# Patient Record
Sex: Female | Born: 1970
Health system: Southern US, Community
[De-identification: ages and names within clinical notes are randomized; demographics above are authoritative.]

## PROBLEM LIST (undated history)

## (undated) DIAGNOSIS — J309 Allergic rhinitis, unspecified: Secondary | ICD-10-CM

## (undated) HISTORY — PX: OTHER SURGICAL HISTORY: SHX169

## (undated) HISTORY — DX: Allergic rhinitis, unspecified: J30.9

---

## 2000-05-07 ENCOUNTER — Ambulatory Visit (HOSPITAL_COMMUNITY): Admission: AD | Admit: 2000-05-07 | Discharge: 2000-05-07 | Payer: Self-pay | Admitting: Obstetrics and Gynecology

## 2000-07-29 ENCOUNTER — Ambulatory Visit (HOSPITAL_COMMUNITY): Admission: RE | Admit: 2000-07-29 | Discharge: 2000-07-29 | Payer: Self-pay | Admitting: Obstetrics and Gynecology

## 2000-07-29 ENCOUNTER — Encounter: Payer: Self-pay | Admitting: Obstetrics and Gynecology

## 2000-08-06 ENCOUNTER — Inpatient Hospital Stay (HOSPITAL_COMMUNITY): Admission: AD | Admit: 2000-08-06 | Discharge: 2000-08-08 | Payer: Self-pay | Admitting: Obstetrics and Gynecology

## 2000-09-10 ENCOUNTER — Other Ambulatory Visit: Admission: RE | Admit: 2000-09-10 | Discharge: 2000-09-10 | Payer: Self-pay | Admitting: Obstetrics and Gynecology

## 2001-11-15 ENCOUNTER — Other Ambulatory Visit: Admission: RE | Admit: 2001-11-15 | Discharge: 2001-11-15 | Payer: Self-pay | Admitting: Obstetrics and Gynecology

## 2002-11-23 ENCOUNTER — Other Ambulatory Visit: Admission: RE | Admit: 2002-11-23 | Discharge: 2002-11-23 | Payer: Self-pay | Admitting: Obstetrics and Gynecology

## 2003-12-26 ENCOUNTER — Other Ambulatory Visit: Admission: RE | Admit: 2003-12-26 | Discharge: 2003-12-26 | Payer: Self-pay | Admitting: Obstetrics and Gynecology

## 2004-12-26 ENCOUNTER — Other Ambulatory Visit: Admission: RE | Admit: 2004-12-26 | Discharge: 2004-12-26 | Payer: Self-pay | Admitting: Obstetrics & Gynecology

## 2012-09-29 ENCOUNTER — Encounter: Payer: Self-pay | Admitting: Family Medicine

## 2012-09-29 ENCOUNTER — Ambulatory Visit (INDEPENDENT_AMBULATORY_CARE_PROVIDER_SITE_OTHER): Payer: 59 | Admitting: Family Medicine

## 2012-09-29 VITALS — BP 120/67 | HR 88 | Temp 98.7°F | Ht 68.0 in | Wt 142.0 lb

## 2012-09-29 DIAGNOSIS — B9689 Other specified bacterial agents as the cause of diseases classified elsewhere: Secondary | ICD-10-CM

## 2012-09-29 DIAGNOSIS — J309 Allergic rhinitis, unspecified: Secondary | ICD-10-CM | POA: Insufficient documentation

## 2012-09-29 DIAGNOSIS — A499 Bacterial infection, unspecified: Secondary | ICD-10-CM

## 2012-09-29 DIAGNOSIS — J329 Chronic sinusitis, unspecified: Secondary | ICD-10-CM

## 2012-09-29 HISTORY — DX: Allergic rhinitis, unspecified: J30.9

## 2012-09-29 MED ORDER — DOXYCYCLINE HYCLATE 100 MG PO TABS: ORAL_TABLET | ORAL | Status: AC

## 2012-09-29 NOTE — Progress Notes (Signed)
CC: Renee Fletcher is a 42 y.o. female is here for Establish Care and Sinusitis   Subjective: HPI:  Very pleasant 42 year old presents to establish care  Acute complaint of pressure between the eyes and just above the left eyebrow. This is been present for 3 weeks, mild to moderate in severity, has not improved whatsoever since onset.  pain is nonradiating, present 24 hours a day, worse when lying down flat. Does not interfere her sleep. Initially associated with ear pressure, irritation of the throat, mild cough, however all of this is resolved. Continues to have clear nasal drainage throughout the day.  Is taking antihistamine and decongestant with Mucinex however no improvement in her symptoms. Has tried Nasonex for a week without improvement.  Review of Systems - General ROS: negative for - chills, fever, night sweats, weight gain or weight loss Ophthalmic ROS: negative for - decreased vision Psychological ROS: negative for - anxiety or depression ENT ROS: negative for - hearing change, tinnitus  Hematological and Lymphatic ROS: negative for - bleeding problems, bruising or swollen lymph nodes Breast ROS: negative Respiratory ROS: no cough, shortness of breath, or wheezing Cardiovascular ROS: no chest pain or dyspnea on exertion Gastrointestinal ROS: no abdominal pain, change in bowel habits, or black or bloody stools Genito-Urinary ROS: negative for - genital discharge, genital ulcers, incontinence or abnormal bleeding from genitals Musculoskeletal ROS: negative for - joint pain or muscle pain Neurological ROS: negative for - headaches or memory loss Dermatological ROS: negative for lumps, mole changes, rash and skin lesion changes  Past Medical History  Diagnosis Date  . Allergic rhinitis 09/29/2012     Family History  Problem Relation Age of Onset  . Liver cancer      aunt     History  Substance Use Topics  . Smoking status: Never Smoker   . Smokeless tobacco: Not on file   . Alcohol Use: No     Objective: Filed Vitals:   09/29/12 1132  BP: 120/67  Pulse: 88  Temp: 98.7 F (37.1 C)    General: Alert and Oriented, No Acute Distress HEENT: Pupils equal, round, reactive to light. Conjunctivae clear.  External ears unremarkable, canals clear with intact TMs with appropriate landmarks.  Middle ear appears open without effusion. Pink inferior turbinates.  Moist mucous membranes, pharynx without inflammation nor lesions.  Neck supple without palpable lymphadenopathy nor abnormal masses. Left frontal sinus tenderness to percussion Lungs: Clear to auscultation bilaterally, no wheezing/ronchi/rales.  Comfortable work of breathing. Good air movement. Cardiac: Regular rate and rhythm. Normal S1/S2.  No murmurs, rubs, nor gallops.   Extremities: No peripheral edema.  Strong peripheral pulses.  Mental Status: No depression, anxiety, nor agitation. Skin: Warm and dry.  Assessment & Plan: [name] was seen today for establish care and sinusitis.  Diagnoses and associated orders for this visit:  Bacterial sinusitis - doxycycline (VIBRA-TABS) 100 MG tablet; One by mouth twice a day for ten days.  Allergic rhinitis  Other Orders - Loratadine (ALAVERT PO); Take by mouth. - GuaiFENesin (MUCINEX PO); Take by mouth. - Multiple Vitamin (MULTIVITAMINS PO); Take by mouth.    Bacterial sinusitis: Penicillin allergy therefore start doxycycline. Continue antihistamine and Mucinex, add nasal saline washes.  May continue Nasonex as well. Signs and symptoms requring emergent/urgent reevaluation were discussed with the patient.  Return in about 4 weeks (around 10/27/2012) for CPE.

## 2016-04-01 ENCOUNTER — Encounter: Payer: Self-pay | Admitting: Osteopathic Medicine

## 2016-04-01 ENCOUNTER — Ambulatory Visit (INDEPENDENT_AMBULATORY_CARE_PROVIDER_SITE_OTHER): Payer: 59 | Admitting: Osteopathic Medicine

## 2016-04-01 VITALS — BP 122/79 | HR 70 | Ht 67.0 in | Wt 127.0 lb

## 2016-04-01 DIAGNOSIS — Z0000 Encounter for general adult medical examination without abnormal findings: Secondary | ICD-10-CM | POA: Diagnosis not present

## 2016-04-01 DIAGNOSIS — F329 Major depressive disorder, single episode, unspecified: Secondary | ICD-10-CM

## 2016-04-01 DIAGNOSIS — F4321 Adjustment disorder with depressed mood: Secondary | ICD-10-CM

## 2016-04-01 DIAGNOSIS — K4090 Unilateral inguinal hernia, without obstruction or gangrene, not specified as recurrent: Secondary | ICD-10-CM

## 2016-04-01 DIAGNOSIS — R7989 Other specified abnormal findings of blood chemistry: Secondary | ICD-10-CM

## 2016-04-01 DIAGNOSIS — Z23 Encounter for immunization: Secondary | ICD-10-CM | POA: Diagnosis not present

## 2016-04-01 DIAGNOSIS — D72819 Decreased white blood cell count, unspecified: Secondary | ICD-10-CM

## 2016-04-01 DIAGNOSIS — J302 Other seasonal allergic rhinitis: Secondary | ICD-10-CM

## 2016-04-01 NOTE — Patient Instructions (Signed)
Please return at your convenience for routine screening blood work. Please let us know if you haven't heard back about your results one week after your blood draw.  We have updated her tetanus vaccination today. We recommend annual flu vaccination as well, please return to the clinic sometime in the fall, September October, to update this vaccine.  Helpful to have previous Pap smear records and mammogram records, if he could have your OB/GYN for redness these results, we would greatly appreciate it  Any other questions or concerns, please do not hesitate to let us know how we can help! Otherwise, let's plan to follow-up in one year for annual physical exam and routine screening lab work.

## 2016-04-01 NOTE — Progress Notes (Signed)
HPI: Renee Fletcher is a 45 y.o. Not Hispanic or Latino female  who presents to [facility] today, 04/01/16,  for chief complaint of:  Chief Complaint  Patient presents with  . Establish Care    No medical complaints, requests annual physical    Preventive care reviewed as below  Request referral to surgeon, patient feeling lump in left inguinal area, comes and goes, worse with standing up.  Positive screening for depression concerns, patient is going through separation with husband at this point, doesn't feel medications or counseling will be helpful to her, no SI.  Past medical, surgical, social and family history reviewed: Past Medical History:  Diagnosis Date  . Allergic rhinitis 09/29/2012   Past Surgical History:  Procedure Laterality Date  . left femur fracture internal fixation     Social History  Substance Use Topics  . Smoking status: Never Smoker  . Smokeless tobacco: Not on file  . Alcohol use No   Family History  Problem Relation Age of Onset  . Liver cancer      aunt     Current medication list and allergy/intolerance information reviewed:   Current Outpatient Prescriptions  Medication Sig Dispense Refill  . BIOTIN PO Take by mouth.    . COLLAGEN PO Take by mouth.    . loratadine-pseudoephedrine (CLARITIN-D 12-HOUR) 5-120 MG tablet Take 1 tablet by mouth 2 (two) times daily.    . Multiple Vitamin (MULTIVITAMINS PO) Take by mouth.    . Saccharomyces boulardii (PROBIOTIC) 250 MG CAPS Take by mouth.     No current facility-administered medications for this visit.    Allergies  Allergen Reactions  . Penicillins     Unsure of reaction; had positive allergy as a child      Review of Systems:  Constitutional:  No  fever, no chills, No recent illness, No unintentional weight changes. No significant fatigue.   HEENT: No  headache, no vision change, no hearing change, No sore throat, No  sinus pressure  Cardiac: No   chest pain, No  pressure, No palpitations, No  Orthopnea  Respiratory:  No  shortness of breath. No  Cough, (+) hay fever / allergies  Gastrointestinal: No  abdominal pain, No  nausea, No  vomiting,  No  blood in stool, No  diarrhea, No  Constipation. (+) concern about hernia  Musculoskeletal: No new myalgia/arthralgia  Genitourinary: No  incontinence, No  abnormal genital bleeding, No abnormal genital discharge  Skin: No  Rash, No other wounds/concerning lesions  Hem/Onc: No  easy bruising/bleeding, No  abnormal lymph node  Endocrine: No cold intolerance,  No heat intolerance. No polyuria/polydipsia/polyphagia   Neurologic: No  weakness, No  dizziness, No  slurred speech/focal weakness/facial droop  Psychiatric: (+) concerns with depression - going through separation with husband, No  concerns with anxiety, No sleep problems, No mood problems   Exam:  BP 122/79   Pulse 70   Ht 5\' 7"  (1.702 m)   Wt 127 lb (57.6 kg)   BMI 19.89 kg/m   Constitutional: VS see above. General Appearance: alert, well-developed, well-nourished, NAD  Eyes: Normal lids and conjunctive, non-icteric sclera  Ears, Nose, Mouth, Throat: MMM, Normal external inspection ears/nares/mouth/lips/gums. TM normal bilaterally. Pharynx/tonsils no erythema, no exudate. Nasal mucosa normal.   Neck: No masses, trachea midline. No thyroid enlargement. No tenderness/mass appreciated. No lymphadenopathy  Respiratory: Normal respiratory effort. no wheeze, no rhonchi, no rales  Cardiovascular: S1/S2 normal, no murmur, no rub/gallop auscultated.  RRR. No lower extremity edema. Pedal pulse II/IV bilaterally DP and PT. No carotid bruit or JVD. No abdominal aortic bruit.  Gastrointestinal: Nontender, no masses. No hepatomegaly, no splenomegaly. No left inguinal hernia appreciated - reducible. Bowel sounds normal. Rectal exam deferred.   Musculoskeletal: Gait normal. No clubbing/cyanosis of digits.   Neurological: No cranial  nerve deficit on limited exam. Motor and sensation intact and symmetric. Cerebellar reflexes intact. Normal balance/coordination. No tremor.   Skin: warm, dry, intact. No rash/ulcer. No concerning nevi or subq nodules on limited exam.    Psychiatric: Normal judgment/insight. Normal mood and affect. Oriented x3.      ASSESSMENT/PLAN:   No major concerns on annual wellness exam. Return in one year unless needed sooner. Encouraged get records from OB/GYN regarding most recent Pap and mammogram results. Encouraged return to clinic in the fall for flu vaccination  Encounter for annual physical exam - Plan: Saccharomyces boulardii (PROBIOTIC) 250 MG CAPS, COLLAGEN PO, BIOTIN PO, CBC with Differential/Platelet, COMPLETE METABOLIC PANEL WITH GFR, Lipid panel  Unilateral inguinal hernia without obstruction or gangrene, recurrence not specified - Plan: Ambulatory referral to General Surgery  Reactive depression (situational)  Need for tetanus booster - Plan: Tdap vaccine greater than or equal to 7yo IM  Seasonal allergies - Plan: loratadine-pseudoephedrine (CLARITIN-D 12-HOUR) 5-120 MG tablet    FEMALE PREVENTIVE CARE  ANNUAL SCREENING/COUNSELING Tobacco - noNever  Alcohol - none Diet/Exercise - HEALTHY HABITS DISCUSSED TO DECREASE CV RISK  Depression - PQH2 Positive situational  Domestic violence concerns - no HTN SCREENING - SEE VITALS Vaccination status - SEE BELOW  SEXUAL HEALTH Sexually active in the past year - Yes with female. STI - The patient denies history of sexually transmitted disease. STI testing today? - no  INFECTIOUS DISEASE SCREENING HIV - all adults 15-65 - needs declined GC/CT - sexually active - needs - declined HepC - DOB [birth year] - does not need TB - does not need  DISEASE SCREENING Lipid - needs - patient will return for fasting labs DM2 - needs - return for fasting labs Osteoporosis - does not need  CANCER SCREENING Cervical - does not need -  following with OBGYN - asked patient to please ask the to send [place] records Breast - does not need - mammograms at OB/GYN, need records Lung - does not need Colon - does not need  ADULT VACCINATION Influenza - was not indicated but encouraged to come back for this in the fall Td - was given HPV - was not indicated Zoster - was not indicated Pneumonia - was not indicated  OTHER Fall - exercise and Vit D age 75+ - does not need Consider ASA - age 49-59 - does not need      Visit summary with medication list and pertinent instructions was printed for patient to review. All questions at time of visit were answered - patient instructed to contact office with any additional concerns. ER/RTC precautions were reviewed with the patient. Follow-up plan: Return in about 1 year (around 04/01/2017), or sooner if needed, for ANNUAL PHYSICAL .

## 2016-04-02 DIAGNOSIS — K469 Unspecified abdominal hernia without obstruction or gangrene: Secondary | ICD-10-CM | POA: Insufficient documentation

## 2016-04-02 DIAGNOSIS — F329 Major depressive disorder, single episode, unspecified: Secondary | ICD-10-CM | POA: Insufficient documentation

## 2016-04-16 LAB — COMPLETE METABOLIC PANEL WITH GFR
ALT: 11 U/L (ref 6–29)
AST: 17 U/L (ref 10–35)
Albumin: 4.1 g/dL (ref 3.6–5.1)
Alkaline Phosphatase: 55 U/L (ref 33–115)
BUN: 16 mg/dL (ref 7–25)
CO2: 27 mmol/L (ref 20–31)
Calcium: 9.3 mg/dL (ref 8.6–10.2)
Chloride: 103 mmol/L (ref 98–110)
Creat: 0.89 mg/dL (ref 0.50–1.10)
GFR, Est African American: 89 mL/min (ref >=60)
GFR, Est Non African American: 77 mL/min (ref >=60)
Glucose, Bld: 75 mg/dL (ref 65–99)
Potassium: 4.3 mmol/L (ref 3.5–5.3)
Sodium: 141 mmol/L (ref 135–146)
Total Bilirubin: 1 mg/dL (ref 0.2–1.2)
Total Protein: 7.1 g/dL (ref 6.1–8.1)

## 2016-04-16 LAB — CBC WITH DIFFERENTIAL/PLATELET
Basophils Absolute: 31 cells/uL (ref 0–200)
Basophils Relative: 1 %
Eosinophils Absolute: 93 cells/uL (ref 15–500)
Eosinophils Relative: 3 %
HCT: 39.9 % (ref 35.0–45.0)
Hemoglobin: 13.4 g/dL (ref 11.7–15.5)
Lymphocytes Relative: 48 %
Lymphs Abs: 1488 cells/uL (ref 850–3900)
MCH: 31.9 pg (ref 27.0–33.0)
MCHC: 33.6 g/dL (ref 32.0–36.0)
MCV: 95 fL (ref 80.0–100.0)
MPV: 9.6 fL (ref 7.5–12.5)
Monocytes Absolute: 465 cells/uL (ref 200–950)
Monocytes Relative: 15 %
Neutro Abs: 1023 cells/uL — ABNORMAL LOW (ref 1500–7800)
Neutrophils Relative %: 33 %
Platelets: 213 10*3/uL (ref 140–400)
RBC: 4.2 MIL/uL (ref 3.80–5.10)
RDW: 12.6 % (ref 11.0–15.0)
WBC: 3.1 10*3/uL — ABNORMAL LOW (ref 3.8–10.8)

## 2016-04-16 LAB — LIPID PANEL
Cholesterol: 154 mg/dL (ref 125–200)
HDL: 63 mg/dL (ref >=46)
LDL CALC: 77 mg/dL (ref <130)
TRIGLYCERIDES: 69 mg/dL (ref <150)
Total CHOL/HDL Ratio: 2.4 Ratio (ref <=5.0)
VLDL: 14 mg/dL (ref <30)

## 2016-04-17 ENCOUNTER — Other Ambulatory Visit: Payer: Self-pay | Admitting: Osteopathic Medicine

## 2016-04-17 NOTE — Addendum Note (Signed)
Addended by: Deirdre PippinsALEXANDER, Dejanay Wamboldt M on: 04/17/2016 07:10 PM   Modules accepted: Orders

## 2016-07-08 ENCOUNTER — Encounter: Payer: Self-pay | Admitting: Osteopathic Medicine

## 2016-07-08 DIAGNOSIS — K4090 Unilateral inguinal hernia, without obstruction or gangrene, not specified as recurrent: Secondary | ICD-10-CM | POA: Insufficient documentation

## 2016-07-28 LAB — HM PAP SMEAR: HM PAP: NEGATIVE

## 2016-10-16 ENCOUNTER — Encounter: Payer: Self-pay | Admitting: Osteopathic Medicine

## 2016-11-12 ENCOUNTER — Encounter: Payer: Self-pay | Admitting: Osteopathic Medicine

## 2016-11-12 ENCOUNTER — Ambulatory Visit (INDEPENDENT_AMBULATORY_CARE_PROVIDER_SITE_OTHER): Payer: 59 | Admitting: Osteopathic Medicine

## 2016-11-12 VITALS — BP 123/72 | HR 75 | Ht 68.0 in | Wt 132.0 lb

## 2016-11-12 DIAGNOSIS — B351 Tinea unguium: Secondary | ICD-10-CM | POA: Diagnosis not present

## 2016-11-12 MED ORDER — CICLOPIROX 8 % EX SOLN: Freq: Every day | CUTANEOUS | 11 refills | Status: DC

## 2016-11-12 MED ORDER — EFINACONAZOLE 10 % EX SOLN: CUTANEOUS | 5 refills | Status: DC

## 2016-11-12 NOTE — Patient Instructions (Signed)

## 2016-11-12 NOTE — Addendum Note (Signed)
Addended by: Deirdre PippinsALEXANDER, Cyrus Ramsburg M on: 11/12/2016 01:54 PM   Modules accepted: Orders

## 2016-11-12 NOTE — Progress Notes (Signed)
HPI: Renee Fletcher is a 46 y.o. female  who presents to [facility] today, 11/12/16,  for chief complaint of:  Chief Complaint  Patient presents with  . Rash    Bilateral feet several toes    Abnormal toenails, worse on great toes, doesn't involve whole nail. OTC treatments for a few weeks not helpful.    Past medical history, surgical history, social history and family history reviewed.  Patient Active Problem List   Diagnosis Date Noted  . Left inguinal hernia 07/08/2016  . Hernia of abdominal cavity 04/02/2016  . Reactive depression (situational) 04/02/2016  . Allergic rhinitis 09/29/2012    Current medication list and allergy/intolerance information reviewed.   Current Outpatient Prescriptions on File Prior to Visit  Medication Sig Dispense Refill  . BIOTIN PO Take by mouth.    . COLLAGEN PO Take by mouth.    . loratadine-pseudoephedrine (CLARITIN-D 12-HOUR) 5-120 MG tablet Take 1 tablet by mouth 2 (two) times daily.    . Multiple Vitamin (MULTIVITAMINS PO) Take by mouth.    . Saccharomyces boulardii (PROBIOTIC) 250 MG CAPS Take by mouth.     No current facility-administered medications on file prior to visit.    Allergies  Allergen Reactions  . Penicillins     Unsure of reaction; had positive allergy test as a child      Review of Systems:  Constitutional: No recent illness  Musculoskeletal: No new myalgia/arthralgia  Skin: nails as per HPI   Exam:  BP 123/72   Pulse 75   Ht 5\' 8"  (1.727 m)   Wt 132 lb (59.9 kg)   BMI 20.07 kg/m   Constitutional: VS see above. General Appearance: alert, well-developed, well-nourished, NAD  Skin: warm, dry, intact. (+) thickening of distal nails on great does c/w onychomycosis  Psychiatric: Normal judgment/insight. Normal mood and affect. Oriented x3.     ASSESSMENT/PLAN:   Pt prefers topical therapy, advised long-term use of this likely needed, option for nail removal w/  podiatry or po meds w/ terbinafine if needed.   Onychomycosis - Plan: Efinaconazole 10 % SOLN    Patient Instructions  Fungal Nail Infection Fungal nail infection is a common fungal infection of the toenails or fingernails. This condition affects toenails more often than fingernails. More than one nail may be infected. The condition can be passed from person to person (is contagious). What are the causes? This condition is caused by a fungus. Several types of funguses can cause the infection. These funguses are common in moist and warm areas. If your hands or feet come into contact with the fungus, it may get into a crack in your fingernail or toenail and cause the infection. What increases the risk? The following factors may make you more likely to develop this condition:  Being female.  Having diabetes.  Being of older age.  Living with someone who has the fungus.  Walking barefoot in areas where the fungus thrives, such as showers or locker rooms.  Having poor circulation.  Wearing shoes and socks that cause your feet to sweat.  Having athlete's foot.  Having a nail injury or history of a recent nail surgery.  Having psoriasis.  Having a weak body defense system (immune system). What are the signs or symptoms? Symptoms of this condition include:  A pale spot on the nail.  Thickening of the nail.  A nail that becomes yellow or brown.  A brittle or ragged nail edge.  A crumbling  nail.  A nail that has lifted away from the nail bed. How is this diagnosed? This condition is diagnosed with a physical exam. Your health care provider may take a scraping or clipping from your nail to test for the fungus. How is this treated? Mild infections do not need treatment. If you have significant nail changes, treatment may include:  Oral antifungal medicines. You may need to take the medicine for several weeks or several months, and you may not see the results for a long time.  These medicines can cause side effects. Ask your health care provider what problems to watch for.  Antifungal nail polish and nail cream. These may be used along with oral antifungal medicines.  Laser treatment of the nail.  Surgery to remove the nail. This may be needed for the most severe infections. Treatment takes a long time, and the infection may come back. Follow these instructions at home: Medicines   Take or apply over-the-counter and prescription medicines only as told by your health care provider.  Ask your health care provider about using over-the-counter mentholated ointment on your nails. Lifestyle    Do not share personal items, such as towels or nail clippers.  Trim your nails often.  Wash and dry your hands and feet every day.  Wear absorbent socks, and change your socks frequently.  Wear shoes that allow air to circulate, such as sandals or canvas tennis shoes. Throw out old shoes.  Wear rubber gloves if you are working with your hands in wet areas.  Do not walk barefoot in shower rooms or locker rooms.  Do not use a nail salon that does not use clean instruments.  Do not use artificial nails. General instructions   Keep all follow-up visits as told by your health care provider. This is important.  Use antifungal foot powder on your feet and in your shoes. Contact a health care provider if: Your infection is not getting better or it is getting worse after several months. This information is not intended to replace advice given to you by your health care provider. Make sure you discuss any questions you have with your health care provider. Document Released: 08/14/2000 Document Revised: 01/23/2016 Document Reviewed: 02/18/2015 Elsevier Interactive Patient Education  2017 [employer]     Follow-up plan: Return for annual physical when due, sooner if needed.  Visit summary with medication list and pertinent instructions was printed for patient to  review, alert us if any changes needed. All questions at time of visit were answered - patient instructed to contact office with any additional concerns. ER/RTC precautions were reviewed with the patient and understanding verbalized.

## 2017-01-18 ENCOUNTER — Encounter: Payer: Self-pay | Admitting: Osteopathic Medicine

## 2017-01-18 DIAGNOSIS — I8390 Asymptomatic varicose veins of unspecified lower extremity: Secondary | ICD-10-CM | POA: Insufficient documentation

## 2017-04-20 ENCOUNTER — Telehealth: Payer: Self-pay | Admitting: Osteopathic Medicine

## 2017-04-20 DIAGNOSIS — B351 Tinea unguium: Secondary | ICD-10-CM

## 2017-04-20 NOTE — Telephone Encounter (Signed)
Pt called. She states she's be using the Ciclopirox for 6 mnths and it has helped a lot but issue with toenail fungus has not completely gone away  She wants to change to another medication and preferably something she can rub on.Marland Kitchen

## 2017-04-21 NOTE — Telephone Encounter (Signed)
Please see note below. 

## 2017-04-23 DIAGNOSIS — B351 Tinea unguium: Secondary | ICD-10-CM | POA: Insufficient documentation

## 2017-04-23 NOTE — Telephone Encounter (Signed)
There really aren't any medications that can rub in, most of these are nail-polish type applications.   We can try an oral medication for up to 12 weeks if she would like, but we would need to do lab work to make sure that the medication would be safe for her. If she is okay with getting some blood work done, and as long as labs are okay, I can call this in for her.   Lab orders are in. Doesn't need to be fasting.   She can also continue the current medication for a little longer, sometimes it takes more than 6 months

## 2017-04-23 NOTE — Telephone Encounter (Signed)
Left message on patient vm to call me back regarding this. Gladiola Madore,CMA

## 2017-04-23 NOTE — Telephone Encounter (Signed)
Spoke to patient gave her advise as noted below. Patient stated that she will wait a littlle while longer before she proceded with anything oral. Renee Fletcher

## 2018-09-08 DIAGNOSIS — Z1231 Encounter for screening mammogram for malignant neoplasm of breast: Secondary | ICD-10-CM | POA: Diagnosis not present

## 2018-09-08 DIAGNOSIS — Z01419 Encounter for gynecological examination (general) (routine) without abnormal findings: Secondary | ICD-10-CM | POA: Diagnosis not present

## 2018-09-08 DIAGNOSIS — Z3202 Encounter for pregnancy test, result negative: Secondary | ICD-10-CM | POA: Diagnosis not present

## 2018-09-08 DIAGNOSIS — Z6822 Body mass index (BMI) 22.0-22.9, adult: Secondary | ICD-10-CM | POA: Diagnosis not present

## 2019-09-14 DIAGNOSIS — Z1231 Encounter for screening mammogram for malignant neoplasm of breast: Secondary | ICD-10-CM | POA: Diagnosis not present

## 2019-09-14 DIAGNOSIS — Z6822 Body mass index (BMI) 22.0-22.9, adult: Secondary | ICD-10-CM | POA: Diagnosis not present

## 2019-09-14 DIAGNOSIS — N951 Menopausal and female climacteric states: Secondary | ICD-10-CM | POA: Diagnosis not present

## 2019-09-14 DIAGNOSIS — Z124 Encounter for screening for malignant neoplasm of cervix: Secondary | ICD-10-CM | POA: Diagnosis not present

## 2019-09-14 DIAGNOSIS — Z01419 Encounter for gynecological examination (general) (routine) without abnormal findings: Secondary | ICD-10-CM | POA: Diagnosis not present

## 2019-09-18 LAB — HM PAP SMEAR: HM Pap smear: NEGATIVE

## 2019-09-19 ENCOUNTER — Other Ambulatory Visit: Payer: Self-pay | Admitting: Obstetrics and Gynecology

## 2019-09-19 ENCOUNTER — Telehealth: Payer: Self-pay | Admitting: Osteopathic Medicine

## 2019-09-19 DIAGNOSIS — Z0000 Encounter for general adult medical examination without abnormal findings: Secondary | ICD-10-CM

## 2019-09-19 DIAGNOSIS — R928 Other abnormal and inconclusive findings on diagnostic imaging of breast: Secondary | ICD-10-CM

## 2019-09-19 NOTE — Telephone Encounter (Signed)
Patient has her Physical scheduled for 09/28/19 and she wants to pick her lab order up this week so please have it ready for her at the front desk

## 2019-09-20 NOTE — Telephone Encounter (Signed)
Pt has been updated and aware lab order available for pick up at front desk. No other inquiries during call.

## 2019-09-20 NOTE — Telephone Encounter (Signed)
Orders are printed, placed in assistant's inbasket

## 2019-09-21 DIAGNOSIS — Z0000 Encounter for general adult medical examination without abnormal findings: Secondary | ICD-10-CM | POA: Diagnosis not present

## 2019-09-22 ENCOUNTER — Encounter: Payer: Self-pay | Admitting: Osteopathic Medicine

## 2019-09-22 LAB — LIPID PANEL
Cholesterol: 183 mg/dL (ref <200)
HDL: 62 mg/dL (ref >=50)
LDL Cholesterol (Calc): 107 mg/dL (calc) — ABNORMAL HIGH
Non-HDL Cholesterol (Calc): 121 mg/dL (calc) (ref <130)
Total CHOL/HDL Ratio: 3 (calc) (ref <5.0)
Triglycerides: 62 mg/dL (ref <150)

## 2019-09-22 LAB — CBC
HCT: 39.9 % (ref 35.0–45.0)
Hemoglobin: 13.6 g/dL (ref 11.7–15.5)
MCH: 32.2 pg (ref 27.0–33.0)
MCHC: 34.1 g/dL (ref 32.0–36.0)
MCV: 94.3 fL (ref 80.0–100.0)
MPV: 9.9 fL (ref 7.5–12.5)
Platelets: 244 10*3/uL (ref 140–400)
RBC: 4.23 10*6/uL (ref 3.80–5.10)
RDW: 11.6 % (ref 11.0–15.0)
WBC: 3.6 10*3/uL — ABNORMAL LOW (ref 3.8–10.8)

## 2019-09-22 LAB — COMPLETE METABOLIC PANEL WITH GFR
AG Ratio: 1.6 (calc) (ref 1.0–2.5)
ALT: 13 U/L (ref 6–29)
AST: 22 U/L (ref 10–35)
Albumin: 4.3 g/dL (ref 3.6–5.1)
Alkaline phosphatase (APISO): 82 U/L (ref 37–153)
BUN: 13 mg/dL (ref 7–25)
CO2: 30 mmol/L (ref 20–32)
Calcium: 9.9 mg/dL (ref 8.6–10.4)
Chloride: 104 mmol/L (ref 98–110)
Creat: 0.92 mg/dL (ref 0.50–1.05)
GFR, Est African American: 84 mL/min/{1.73_m2} (ref >=60)
GFR, Est Non African American: 73 mL/min/{1.73_m2} (ref >=60)
Globulin: 2.7 g/dL (calc) (ref 1.9–3.7)
Glucose, Bld: 96 mg/dL (ref 65–99)
Potassium: 4.8 mmol/L (ref 3.5–5.3)
Sodium: 141 mmol/L (ref 135–146)
Total Bilirubin: 0.9 mg/dL (ref 0.2–1.2)
Total Protein: 7 g/dL (ref 6.1–8.1)

## 2019-09-28 ENCOUNTER — Encounter: Payer: Self-pay | Admitting: Osteopathic Medicine

## 2019-09-28 ENCOUNTER — Other Ambulatory Visit: Payer: Self-pay

## 2019-09-28 ENCOUNTER — Ambulatory Visit (INDEPENDENT_AMBULATORY_CARE_PROVIDER_SITE_OTHER): Payer: BLUE CROSS/BLUE SHIELD | Admitting: Osteopathic Medicine

## 2019-09-28 VITALS — BP 115/78 | HR 99 | Temp 98.2°F | Wt 144.1 lb

## 2019-09-28 DIAGNOSIS — Z1211 Encounter for screening for malignant neoplasm of colon: Secondary | ICD-10-CM | POA: Diagnosis not present

## 2019-09-28 DIAGNOSIS — Z0000 Encounter for general adult medical examination without abnormal findings: Secondary | ICD-10-CM

## 2019-09-28 DIAGNOSIS — Z780 Asymptomatic menopausal state: Secondary | ICD-10-CM | POA: Diagnosis not present

## 2019-09-28 DIAGNOSIS — Z23 Encounter for immunization: Secondary | ICD-10-CM | POA: Diagnosis not present

## 2019-09-28 NOTE — Progress Notes (Signed)
Renee Fletcher is a 49 y.o. female who  has a past medical history of Allergic rhinitis (09/29/2012).  she presents to [facility] today, 09/28/19,  for chief complaint of: Annual physical  Menopause - vaginal dryness     Patient here for annual physical / wellness exam.  See preventive care reviewed as below.  Recent labs reviewed in detail with the patient.   Additional concerns today include:  Vaginal dryness after menopause, no significant hor flashes or mood changes.    Past medical, surgical, social and family history reviewed:  Patient Active Problem List   Diagnosis Date Noted  . Onychomycosis 04/23/2017  . Varicose vein of leg 01/18/2017  . Left inguinal hernia 07/08/2016  . Hernia of abdominal cavity 04/02/2016  . Reactive depression (situational) 04/02/2016  . Allergic rhinitis 09/29/2012    Past Surgical History:  Procedure Laterality Date  . left femur fracture internal fixation      Social History   Tobacco Use  . Smoking status: Never Smoker  . Smokeless tobacco: Never Used  Substance Use Topics  . Alcohol use: No    Family History  Problem Relation Age of Onset  . Liver cancer Unknown        aunt  . Diabetes Father   . Cancer Maternal Aunt        pancreatic and liver   . Hypertension Maternal Grandmother   . Cancer Maternal Grandmother        breast  . Hypertension Maternal Grandfather   . Cancer Paternal Grandfather        liver     Current medication list and allergy/intolerance information reviewed:    Current Outpatient Medications  Medication Sig Dispense Refill  . BIOTIN PO Take by mouth.    . COLLAGEN PO Take by mouth.    . loratadine-pseudoephedrine (CLARITIN-D 12-HOUR) 5-120 MG tablet Take 1 tablet by mouth daily.     . Magnesium 250 MG TABS Take by mouth.    . Multiple Vitamin (MULTIVITAMINS PO) Take by mouth.    . Omega-3 Fatty Acids (FISH OIL) 1000 MG CAPS Take by mouth.    .  Saccharomyces boulardii (PROBIOTIC) 250 MG CAPS Take by mouth.    . vitamin E 1000 UNIT capsule Take 1,000 Units by mouth daily.    . ciclopirox (PENLAC) 8 % solution Apply topically at bedtime. Apply over nail and surrounding skin. Apply daily over previous coat. After seven (7) days, may remove with alcohol and continue cycle. (Patient not taking: Reported on 09/28/2019) 6.6 mL 11   No current facility-administered medications for this visit.    Allergies  Allergen Reactions  . Penicillins Rash    Unsure of reaction; had positive allergy test as a child       Exam:  BP 115/78 (BP Location: Left Arm, Patient Position: Sitting, Cuff Size: Normal)   Pulse 99   Temp 98.2 F (36.8 C) (Oral)   Wt 144 lb 1.9 oz (65.4 kg)   BMI 21.91 kg/m   Constitutional: VS see above. General Appearance: alert, well-developed, well-nourished, NAD  Eyes: Normal lids and conjunctive, non-icteric sclera  Neck: No masses, trachea midline. No thyroid enlargement. No tenderness/mass appreciated. No lymphadenopathy  Respiratory: Normal respiratory effort. no wheeze, no rhonchi, no rales  Cardiovascular: S1/S2 normal, no murmur, no rub/gallop auscultated. RRR. No lower extremity edema. Pedal pulse II/IV bilaterally DP and PT. No carotid bruit or JVD. No abdominal aortic bruit.  Gastrointestinal: Nontender,  no masses. No hepatomegaly, no splenomegaly. No hernia appreciated. Bowel sounds normal. Rectal exam deferred.   Musculoskeletal: Gait normal. No clubbing/cyanosis of digits.   Neurological: Normal balance/coordination. No tremor. No cranial nerve deficit on limited exam. Motor and sensation intact and symmetric. Cerebellar reflexes intact.   Skin: warm, dry, intact. No rash/ulcer. No concerning nevi or subq nodules on limited exam.    Psychiatric: Normal judgment/insight. Normal mood and affect. Oriented x3.    No results found for this or any previous visit (from the past 72 hour(s)).  No  results found.      ASSESSMENT/PLAN: The primary encounter diagnosis was Annual physical exam. Diagnoses of Need for shingles vaccine, Screen for colon cancer, and Postmenopausal were also pertinent to this visit.   Pt will get back to me re: vaginal estrogen if desired   Orders Placed This Encounter  Procedures  . Varicella-zoster vaccine IM (Shingrix)  . Ambulatory referral to Gastroenterology    No orders of the defined types were placed in this encounter.   Patient Instructions  General Preventive Care  Most recent routine screening lipids/other labs: already done!   Everyone should have blood pressure checked once per year.   Tobacco: don't!  Alcohol: responsible moderation is ok for most adults - if you have concerns about your alcohol intake, please talk to me!   Exercise: as tolerated to reduce risk of cardiovascular disease and diabetes. Strength training will also prevent osteoporosis.   Mental health: if need for mental health care (medicines, counseling, other), or concerns about moods, please let me know!   Sexual health: if need for STD testing, or if concerns with libido/pain problems, please let me know!   Advanced Directive: Living Will and/or Healthcare Power of Attorney recommended for all adults, regardless of age or health.  Vaccines  Flu vaccine: recommended for almost everyone, every fall.   Shingles vaccine: Shingrix recommended after age 69.   Pneumonia vaccines: Prevnar and Pneumovax recommended after age 41, or sooner if certain medical conditions.  Tetanus booster: Tdap recommended every 10 years. Due 2027  Cancer screenings   Colon cancer screening: recommended for everyone at age 68  Breast cancer screening: mammogram recommended annually after age 48.   Cervical cancer screening: Pap every 1 to 5 years depending on age and other risk factors. Can usually stop at age 34 or w/ hysterectomy.   Lung cancer screening: not needed for  non-smokers  Infection screenings . HIV: recommended screening at least once age 71-65, more often as needed. . Gonorrhea/Chlamydia: screening as needed . Hepatitis C: recommended for anyone born 25-1967 (not needed for you) . TB: certain at-risk populations, or depending on work requirements and/or travel history Other . Bone Density Test: recommended for women at age 105     The 10-year ASCVD risk score Denman George DC Montez Hageman., et al., 2013) is: 0.8%   Values used to calculate the score:     Age: 79 years     Sex: Female     Is Non-Hispanic African American: No     Diabetic: No     Tobacco smoker: No     Systolic Blood Pressure: 115 mmHg     Is BP treated: No     HDL Cholesterol: 62 mg/dL     Total Cholesterol: 183 mg/dL    Visit summary with medication list and pertinent instructions was printed for patient to review. All questions at time of visit were answered - patient instructed to contact office with  any additional concerns or updates. ER/RTC precautions were reviewed with the patient.    Please note: voice recognition software was used to produce this document, and typos may escape review. Please contact Dr. Sheppard Coil for any needed clarifications.     Follow-up plan: Return in about 1 year (around 09/27/2020) for [place] (call week prior to visit for lab orders).

## 2019-09-28 NOTE — Patient Instructions (Signed)
General Preventive Care  Most recent routine screening lipids/other labs: already done!   Everyone should have blood pressure checked once per year.   Tobacco: don't!  Alcohol: responsible moderation is ok for most adults - if you have concerns about your alcohol intake, please talk to me!   Exercise: as tolerated to reduce risk of cardiovascular disease and diabetes. Strength training will also prevent osteoporosis.   Mental health: if need for mental health care (medicines, counseling, other), or concerns about moods, please let me know!   Sexual health: if need for STD testing, or if concerns with libido/pain problems, please let me know!   Advanced Directive: Living Will and/or Healthcare Power of Attorney recommended for all adults, regardless of age or health.  Vaccines  Flu vaccine: recommended for almost everyone, every fall.   Shingles vaccine: Shingrix recommended after age 51.   Pneumonia vaccines: Prevnar and Pneumovax recommended after age 61, or sooner if certain medical conditions.  Tetanus booster: Tdap recommended every 10 years. Due 2027  Cancer screenings   Colon cancer screening: recommended for everyone at age 96  Breast cancer screening: mammogram recommended annually after age 27.   Cervical cancer screening: Pap every 1 to 5 years depending on age and other risk factors. Can usually stop at age 55 or w/ hysterectomy.   Lung cancer screening: not needed for non-smokers  Infection screenings . HIV: recommended screening at least once age 39-65, more often as needed. . Gonorrhea/Chlamydia: screening as needed . Hepatitis C: recommended for anyone born 79-1967 (not needed for you) . TB: certain at-risk populations, or depending on work requirements and/or travel history Other . Bone Density Test: recommended for women at age 63

## 2019-09-29 ENCOUNTER — Ambulatory Visit
Admission: RE | Admit: 2019-09-29 | Discharge: 2019-09-29 | Disposition: A | Discharge status: Home / Self Care | Payer: 59 | Source: Ambulatory Visit | Attending: Obstetrics and Gynecology | Admitting: Obstetrics and Gynecology

## 2019-09-29 ENCOUNTER — Ambulatory Visit
Admission: RE | Admit: 2019-09-29 | Discharge: 2019-09-29 | Disposition: A | Discharge status: Home / Self Care | Payer: BLUE CROSS/BLUE SHIELD | Source: Ambulatory Visit | Attending: Obstetrics and Gynecology | Admitting: Obstetrics and Gynecology

## 2019-09-29 DIAGNOSIS — R928 Other abnormal and inconclusive findings on diagnostic imaging of breast: Secondary | ICD-10-CM

## 2019-09-29 DIAGNOSIS — N6489 Other specified disorders of breast: Secondary | ICD-10-CM | POA: Diagnosis not present

## 2019-09-29 DIAGNOSIS — R922 Inconclusive mammogram: Secondary | ICD-10-CM | POA: Diagnosis not present

## 2019-09-29 LAB — HM MAMMOGRAPHY

## 2019-12-06 ENCOUNTER — Ambulatory Visit: Payer: BLUE CROSS/BLUE SHIELD

## 2019-12-28 DIAGNOSIS — Z1211 Encounter for screening for malignant neoplasm of colon: Secondary | ICD-10-CM | POA: Diagnosis not present

## 2019-12-28 DIAGNOSIS — K648 Other hemorrhoids: Secondary | ICD-10-CM | POA: Diagnosis not present

## 2019-12-28 DIAGNOSIS — D120 Benign neoplasm of cecum: Secondary | ICD-10-CM | POA: Diagnosis not present

## 2019-12-28 LAB — HM COLONOSCOPY

## 2020-01-10 ENCOUNTER — Encounter: Payer: Self-pay | Admitting: Osteopathic Medicine

## 2020-01-24 ENCOUNTER — Other Ambulatory Visit: Payer: Self-pay

## 2020-01-24 ENCOUNTER — Ambulatory Visit (INDEPENDENT_AMBULATORY_CARE_PROVIDER_SITE_OTHER): Payer: BLUE CROSS/BLUE SHIELD | Admitting: Osteopathic Medicine

## 2020-01-24 VITALS — BP 108/76 | HR 86 | Temp 97.7°F

## 2020-01-24 DIAGNOSIS — Z23 Encounter for immunization: Secondary | ICD-10-CM | POA: Diagnosis not present

## 2020-01-24 NOTE — Progress Notes (Signed)
Patient presents today for Shingrix immunization. This is the 2nd of 2 injections.  ° °Patient denies CP, palpitations, ShOB, abd pain, headache, mood swings.  ° °Immunization given IM in RD. Pt tolerated immunization well without complications. Advised pt to contact us should any complications arise.  °

## 2020-02-05 ENCOUNTER — Encounter: Payer: Self-pay | Admitting: Osteopathic Medicine

## 2020-10-27 IMAGING — MG MM DIGITAL DIAGNOSTIC UNILAT*L* W/ TOMO W/ CAD
4 series · 4 of 12 positions shown · non-contrast
Comparison: September 14, 2019

CLINICAL DATA: 48-year-old patient recalled from recent screening
mammogram for evaluation of a possible mass in the inferior left
breast identified on recent screening mammogram.

EXAM:
DIGITAL DIAGNOSTIC LEFT MAMMOGRAM WITH CAD AND TOMO
ULTRASOUND LEFT BREAST

[L ML synth-2D]
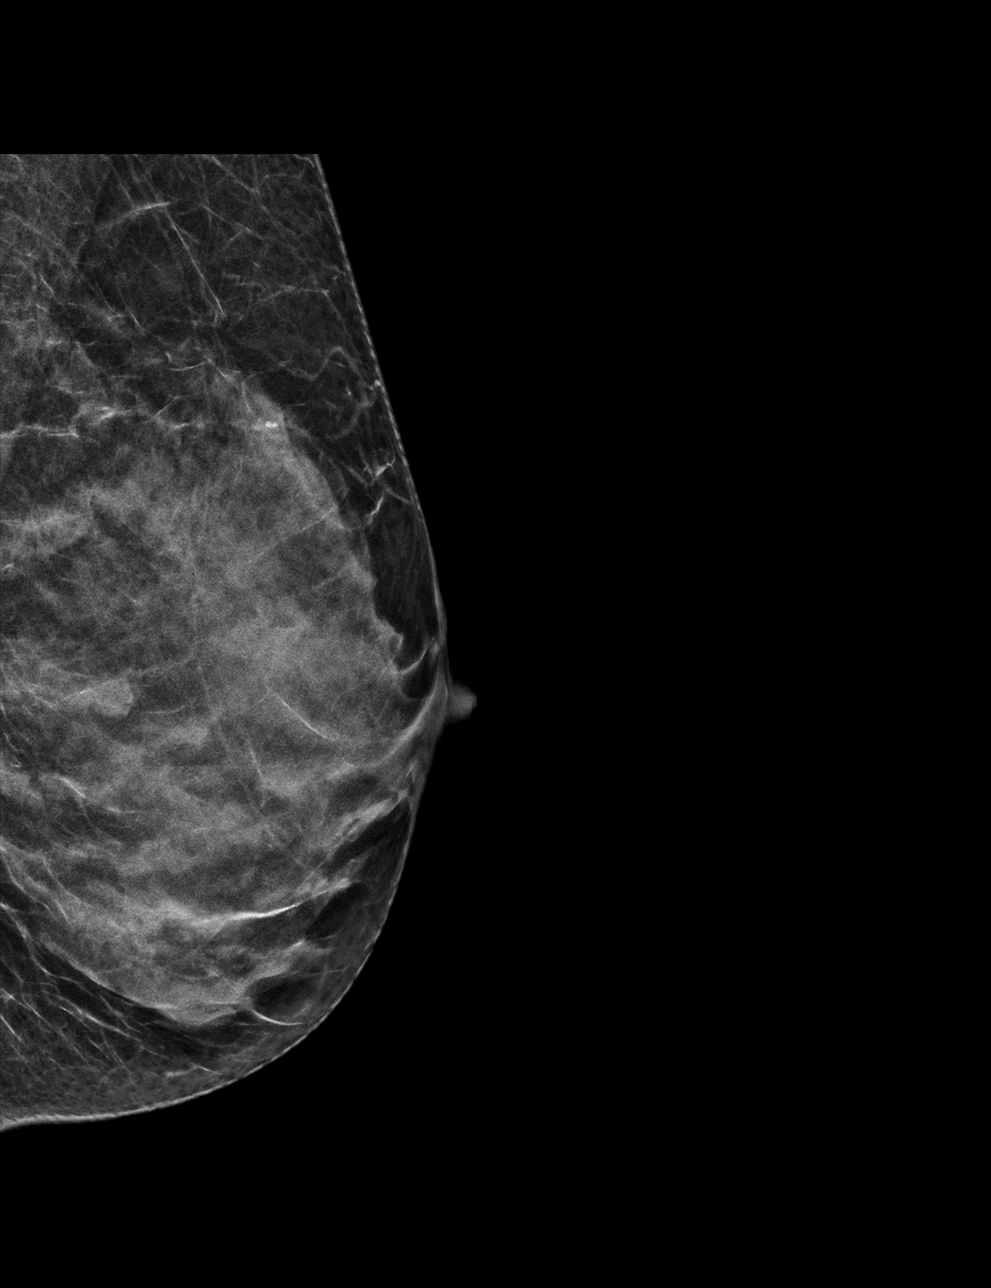

[L MLO synth-2D]
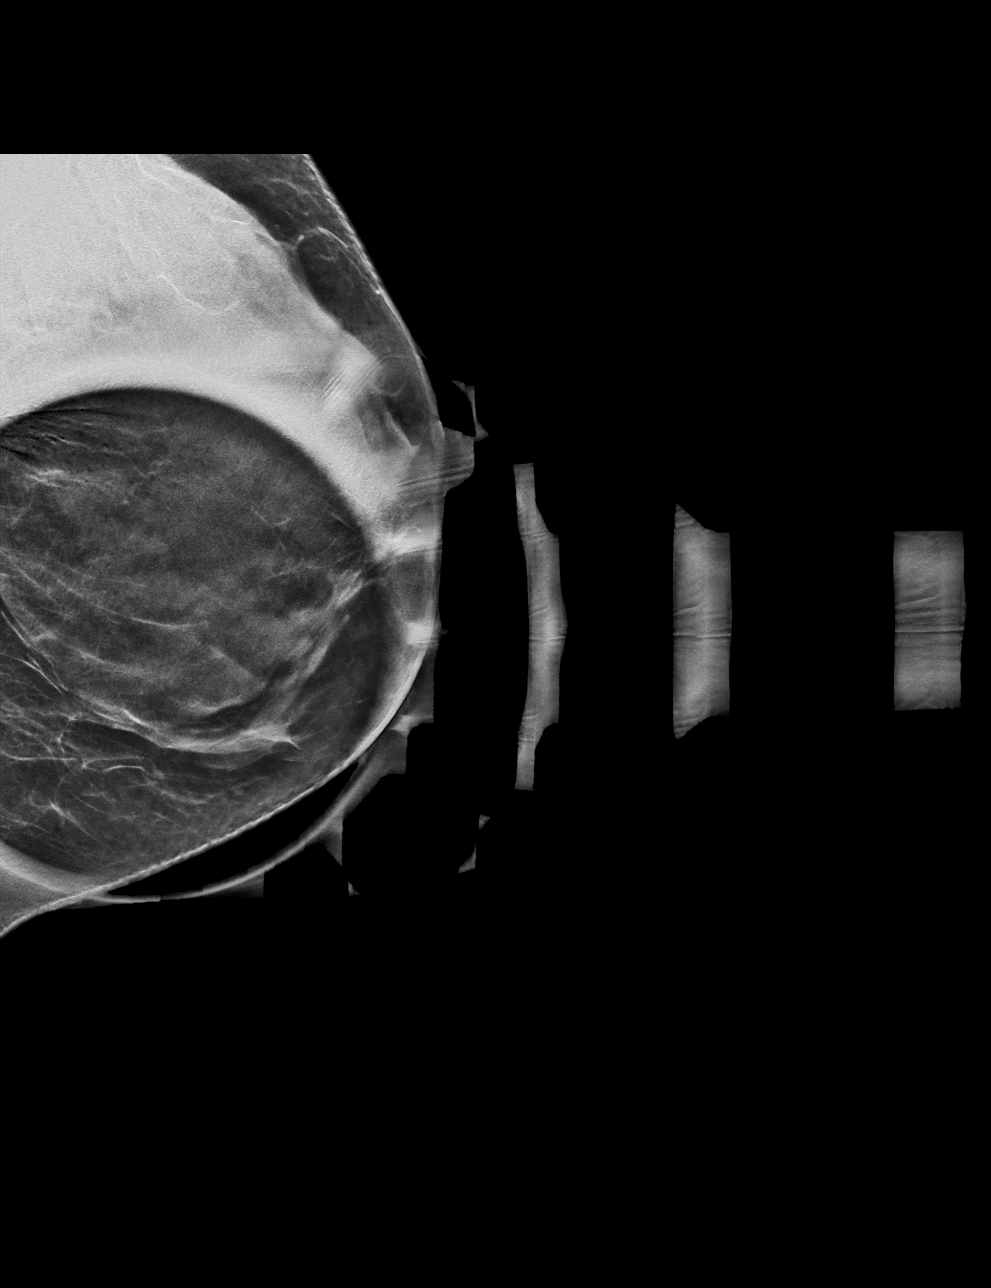

[L ML tomo · tomo slice 22/43.0]
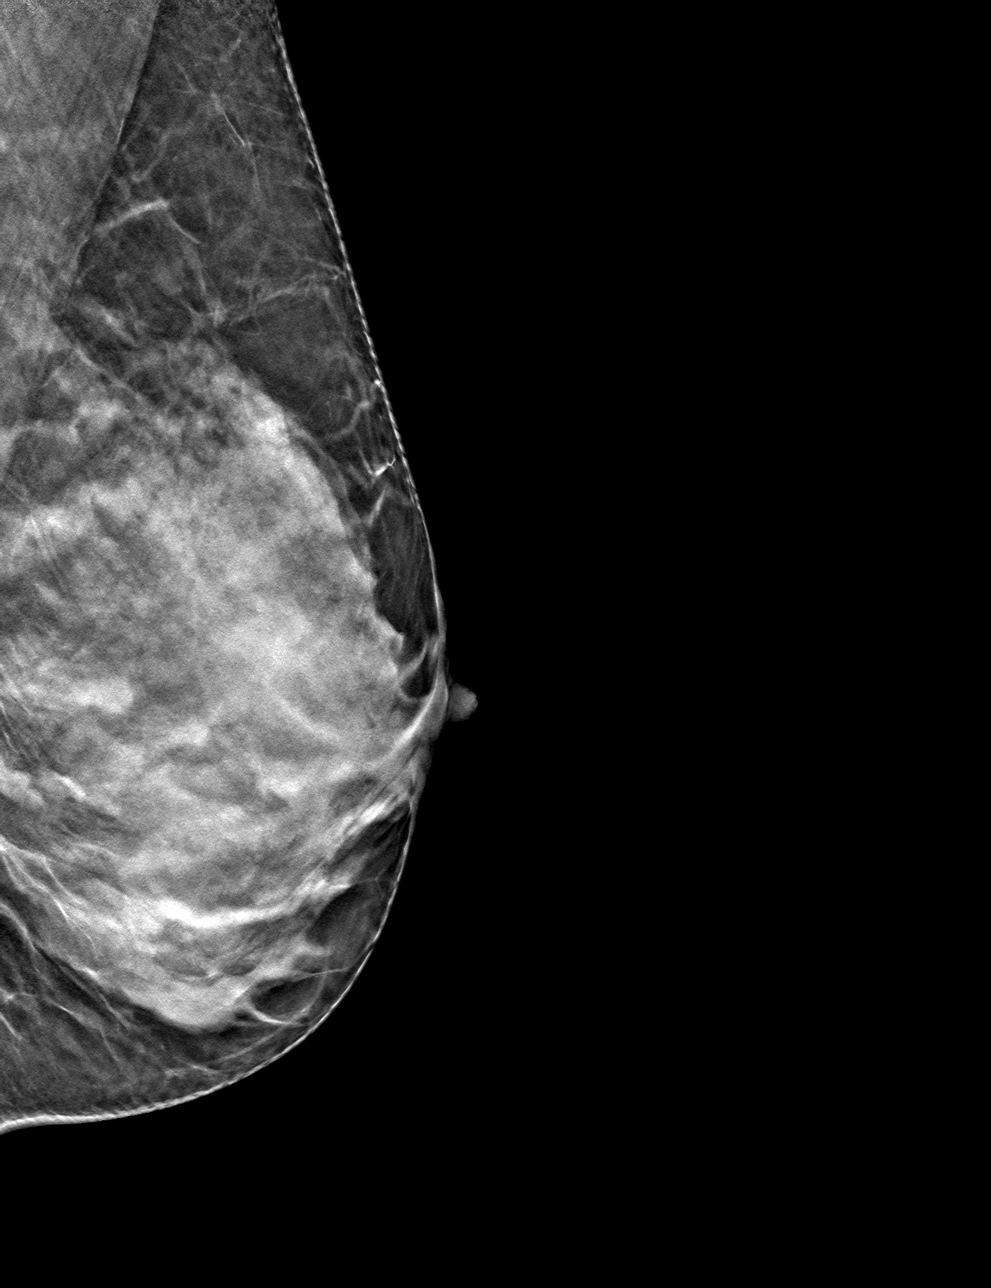

[L MLO tomo · tomo slice 23/44.0]
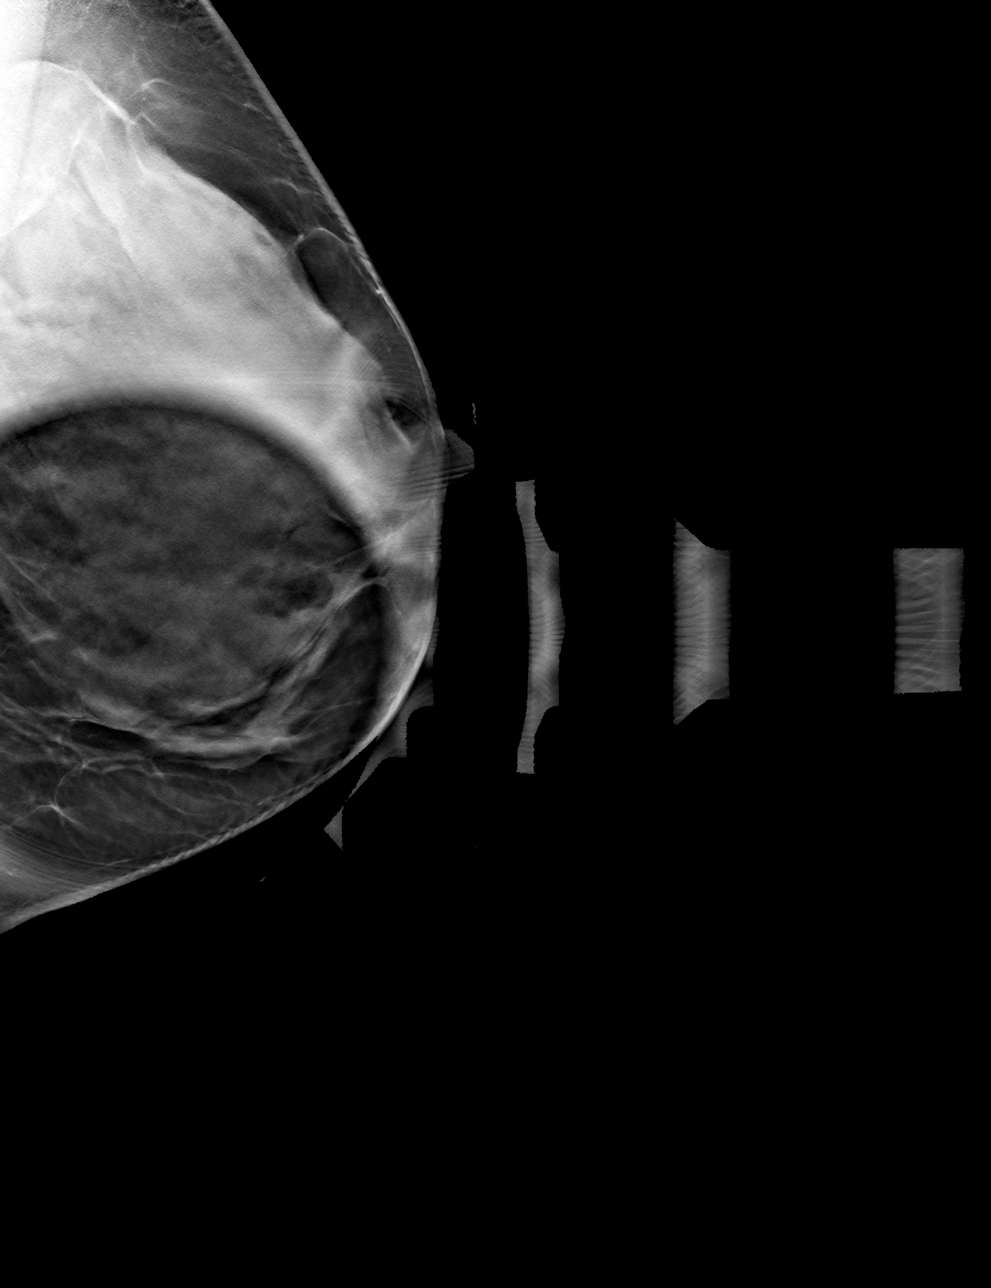

[4 of 12 positions shown; findings below may reference images not displayed]

ACR Breast Density Category d: The breast tissue is extremely dense,
which lowers the sensitivity of mammography.
FINDINGS: Spot compression view of the inferior left breast with tomography
shows extremely dense breast parenchyma. There is a possible mass
margin, versus demarcation between the dense island of glandular
tissue and adjacent fatty tissue. The appearance of the inferior
left breast is similar on the 90 degree lateral view performed today
compared to the recent screening MLO view. Ultrasound will also be
performed.

Mammographic images were processed with CAD.

Targeted ultrasound is performed, showing dense fibroglandular
tissue in the inferior central, lower inner and lower outer
quadrants of the left breast. No solid or cystic mass is identified.
IMPRESSION: The area concern on recent screening mammogram corresponds to normal
dense glandular tissue. No evidence of malignancy.

RECOMMENDATION:
Screening mammogram in one year.(Code:[id number]-[id number])

I have discussed the findings and recommendations with the patient.
If applicable, a reminder letter will be sent to the patient
regarding the next appointment.

BI-RADS CATEGORY  1: Negative.

## 2021-10-31 LAB — HM PAP SMEAR: HM Pap smear: NEGATIVE

## 2021-10-31 LAB — HM MAMMOGRAPHY

## 2021-12-22 ENCOUNTER — Encounter: Payer: BLUE CROSS/BLUE SHIELD | Admitting: Medical-Surgical

## 2022-02-06 ENCOUNTER — Encounter: Payer: Self-pay | Admitting: Medical-Surgical

## 2022-02-06 ENCOUNTER — Ambulatory Visit (INDEPENDENT_AMBULATORY_CARE_PROVIDER_SITE_OTHER): Payer: 59 | Admitting: Medical-Surgical

## 2022-02-06 VITALS — BP 118/79 | HR 85 | Resp 20 | Ht 68.0 in | Wt 137.5 lb

## 2022-02-06 DIAGNOSIS — Z7689 Persons encountering health services in other specified circumstances: Secondary | ICD-10-CM

## 2022-02-06 DIAGNOSIS — Z0000 Encounter for general adult medical examination without abnormal findings: Secondary | ICD-10-CM | POA: Diagnosis not present

## 2022-02-06 DIAGNOSIS — Z87898 Personal history of other specified conditions: Secondary | ICD-10-CM

## 2022-02-06 DIAGNOSIS — Z131 Encounter for screening for diabetes mellitus: Secondary | ICD-10-CM

## 2022-02-06 DIAGNOSIS — Z1329 Encounter for screening for other suspected endocrine disorder: Secondary | ICD-10-CM

## 2022-02-06 MED ORDER — SCOPOLAMINE 1 MG/3DAYS TD PT72: 1.0000 | MEDICATED_PATCH | TRANSDERMAL | 1 refills | Status: DC

## 2022-02-06 NOTE — Progress Notes (Signed)
Complete physical exam  Patient: Renee Fletcher   DOB: 1970-10-17   51 y.o. Female  MRN: [id number]  Subjective:    Chief Complaint  Patient presents with   Annual Exam   Transitions Of Care    Renee Fletcher is a 51 y.o. female who presents today to transfer care to a new PCP and for a complete physical exam. She reports consuming a general diet. Home exercise routine includes weights, walking, biking. She generally feels well. She reports sleeping well. She does not have additional problems to discuss today.    Most recent fall risk assessment:    02/06/2022    1:31 PM  Fall Risk   Falls in the past year? 0  Number falls in past yr: 0  Injury with Fall? 0  Risk for fall due to : No Fall Risks  Follow up Falls evaluation completed     Most recent depression screenings:    02/06/2022    1:31 PM 09/28/2019    9:31 AM  PHQ 2/9 Scores  PHQ - 2 Score 0 1  PHQ- 9 Score  3    Vision:Not within last year , Dental: No current dental problems and Receives regular dental care, and STD: The patient denies history of sexually transmitted disease.    Patient Care Team: Christen Butter, NP as PCP - General (Nurse Practitioner)   Outpatient Medications Prior to Visit  Medication Sig   BIOTIN PO Take by mouth.   ciclopirox (PENLAC) 8 % solution Apply topically at bedtime. Apply over nail and surrounding skin. Apply daily over previous coat. After seven (7) days, may remove with alcohol and continue cycle.   COLLAGEN PO Take by mouth.   loratadine-pseudoephedrine (CLARITIN-D 12-HOUR) 5-120 MG tablet Take 1 tablet by mouth daily.    Magnesium 250 MG TABS Take by mouth.   Multiple Vitamin (MULTIVITAMINS PO) Take by mouth.   Multiple Vitamins-Minerals (HAIR SKIN NAILS PO) Hair, Skin and Nails Advanced   Nutritional Supplements (ESTROVEN PO) Estroven   Saccharomyces boulardii (PROBIOTIC) 250 MG CAPS Take by mouth.   [DISCONTINUED] Omega-3 Fatty Acids (FISH OIL) 1000 MG CAPS Take by  mouth. (Patient not taking: Reported on 02/06/2022)   [DISCONTINUED] vitamin E 1000 UNIT capsule Take 1,000 Units by mouth daily. (Patient not taking: Reported on 02/06/2022)   No facility-administered medications prior to visit.   Review of Systems  Constitutional:  Negative for chills, fever and malaise/fatigue.  Respiratory:  Negative for cough, shortness of breath and wheezing.   Cardiovascular:  Negative for chest pain, palpitations and leg swelling.  Neurological:  Negative for dizziness and headaches.  Psychiatric/Behavioral:  Negative for depression and suicidal ideas. The patient is not nervous/anxious and does not have insomnia.       Objective:     BP 118/79   Pulse 85   Resp 20   Ht 5\' 8"  (1.727 m)   Wt 137 lb 8 oz (62.4 kg)   SpO2 98%   BMI 20.91 kg/m    Physical Exam Constitutional:      General: She is not in acute distress.    Appearance: Normal appearance. She is not ill-appearing.  HENT:     Head: Normocephalic and atraumatic.     Right Ear: Tympanic membrane normal.     Left Ear: Tympanic membrane normal.     Nose: Nose normal.     Mouth/Throat:     Mouth: Mucous membranes are moist.     Pharynx: No  oropharyngeal exudate or posterior oropharyngeal erythema.  Eyes:     Extraocular Movements: Extraocular movements intact.     Conjunctiva/sclera: Conjunctivae normal.     Pupils: Pupils are equal, round, and reactive to light.  Neck:     Thyroid: No thyromegaly.     Vascular: No carotid bruit or JVD.     Trachea: Trachea normal.  Cardiovascular:     Rate and Rhythm: Normal rate and regular rhythm.     Pulses: Normal pulses.     Heart sounds: Normal heart sounds. No murmur heard.    No friction rub. No gallop.  Pulmonary:     Effort: Pulmonary effort is normal. No respiratory distress.     Breath sounds: Normal breath sounds. No wheezing.  Abdominal:     General: Bowel sounds are normal. There is no distension.     Palpations: Abdomen is soft.      Tenderness: There is no abdominal tenderness. There is no guarding.  Musculoskeletal:        General: Normal range of motion.     Cervical back: Normal range of motion and neck supple.  Skin:    General: Skin is warm and dry.  Neurological:     Mental Status: She is alert and oriented to person, place, and time.     Cranial Nerves: No cranial nerve deficit.  Psychiatric:        Mood and Affect: Mood normal.        Behavior: Behavior normal.        Thought Content: Thought content normal.        Judgment: Judgment normal.    No results found for any visits on 02/06/22.     Assessment & Plan:    Routine Health Maintenance and Physical Exam  Immunization History  Administered Date(s) Administered   Tdap 04/01/2016   Zoster Recombinat (Shingrix) 09/28/2019, 01/24/2020    Health Maintenance  Topic Date Due   COVID-19 Vaccine (1) Never done   MAMMOGRAM  09/28/2021   Hepatitis C Screening  02/07/2023 (Originally 11/18/1988)   HIV Screening  02/07/2023 (Originally [birth year]18[birth year]1983)   INFLUENZA VACCINE  03/31/2022   PAP SMEAR-Modifier  09/17/2022   TETANUS/TDAP  04/01/2026   COLONOSCOPY (Pts 45-36yrs Insurance coverage will need to be confirmed)  12/28/2026   Zoster Vaccines- Shingrix  Completed   HPV VACCINES  Aged Out    Discussed health benefits of physical activity, and encouraged her to engage in regular exercise appropriate for her age and condition.  1. Annual physical exam Checking labs as below.  Recommend updating eye exam but otherwise up-to-date on preventative care.  Wellness information provided with AVS. - Lipid panel - COMPLETE METABOLIC PANEL WITH GFR - CBC with Differential/Platelet  2. Thyroid disorder screen Checking TSH. - TSH  3. Diabetes mellitus screening Checking Hemoglobin A1c. - Hemoglobin A1c  4. Encounter to establish care Reviewed available information and discussed care concerns with patient.   5. History of motion sickness Scopolamine  patches ordered for her upcoming trip to the [place].  Return in about 1 year (around 02/07/2023) for annual physical exam or sooner if needed.   ___________________________________________ Thayer Ohm, DNP, APRN, FNP-BC Primary Care and Sports Medicine [facility]

## 2022-02-09 ENCOUNTER — Other Ambulatory Visit: Payer: Self-pay | Admitting: Medical-Surgical

## 2022-02-09 ENCOUNTER — Telehealth: Payer: Self-pay

## 2022-02-09 MED ORDER — IPRATROPIUM BROMIDE 0.03 % NA SOLN: 2.0000 | Freq: Two times a day (BID) | NASAL | 1 refills | Status: AC

## 2022-02-09 NOTE — Telephone Encounter (Signed)
Patient called wanting to know if you were still going to call in nasal spray for her.

## 2022-05-16 ENCOUNTER — Encounter: Payer: Self-pay | Admitting: Medical-Surgical

## 2022-05-19 ENCOUNTER — Encounter: Payer: Self-pay | Admitting: Medical-Surgical

## 2023-09-24 ENCOUNTER — Encounter: Payer: 59 | Admitting: Medical-Surgical

## 2023-09-28 ENCOUNTER — Encounter: Payer: Self-pay | Admitting: Medical-Surgical

## 2023-09-28 ENCOUNTER — Ambulatory Visit (INDEPENDENT_AMBULATORY_CARE_PROVIDER_SITE_OTHER): Payer: 59 | Admitting: Medical-Surgical

## 2023-09-28 VITALS — BP 114/77 | HR 72 | Resp 20 | Ht 68.0 in | Wt 138.4 lb

## 2023-09-28 DIAGNOSIS — Z13220 Encounter for screening for lipoid disorders: Secondary | ICD-10-CM

## 2023-09-28 DIAGNOSIS — R42 Dizziness and giddiness: Secondary | ICD-10-CM | POA: Insufficient documentation

## 2023-09-28 DIAGNOSIS — R32 Unspecified urinary incontinence: Secondary | ICD-10-CM | POA: Insufficient documentation

## 2023-09-28 DIAGNOSIS — Z833 Family history of diabetes mellitus: Secondary | ICD-10-CM

## 2023-09-28 DIAGNOSIS — Z0000 Encounter for general adult medical examination without abnormal findings: Secondary | ICD-10-CM | POA: Diagnosis not present

## 2023-09-28 NOTE — Patient Instructions (Signed)
Preventive Care 48-55 Years Old, Female  Preventive care refers to lifestyle choices and visits with your health care provider that can promote health and wellness. Preventive care visits are also called wellness exams.  What can I expect for my preventive care visit?  Counseling  Your health care provider may ask you questions about your:  Medical history, including:  Past medical problems.  Family medical history.  Pregnancy history.  Current health, including:  Menstrual cycle.  Method of birth control.  Emotional well-being.  Home life and relationship well-being.  Sexual activity and sexual health.  Lifestyle, including:  Alcohol, nicotine or tobacco, and drug use.  Access to firearms.  Diet, exercise, and sleep habits.  Work and work Astronomer.  Sunscreen use.  Safety issues such as seatbelt and bike helmet use.  Physical exam  Your health care provider will check your:  Height and weight. These may be used to calculate your BMI (body mass index). BMI is a measurement that tells if you are at a healthy weight.  Waist circumference. This measures the distance around your waistline. This measurement also tells if you are at a healthy weight and may help predict your risk of certain diseases, such as type 2 diabetes and high blood pressure.  Heart rate and blood pressure.  Body temperature.  Skin for abnormal spots.  What immunizations do I need?    Vaccines are usually given at various ages, according to a schedule. Your health care provider will recommend vaccines for you based on your age, medical history, and lifestyle or other factors, such as travel or where you work.  What tests do I need?  Screening  Your health care provider may recommend screening tests for certain conditions. This may include:  Lipid and cholesterol levels.  Diabetes screening. This is done by checking your blood sugar (glucose) after you have not eaten for a while (fasting).  Pelvic exam and Pap test.  Hepatitis B test.  Hepatitis C  test.  HIV (human immunodeficiency virus) test.  STI (sexually transmitted infection) testing, if you are at risk.  Lung cancer screening.  Colorectal cancer screening.  Mammogram. Talk with your health care provider about when you should start having regular mammograms. This may depend on whether you have a family history of breast cancer.  BRCA-related cancer screening. This may be done if you have a family history of breast, ovarian, tubal, or peritoneal cancers.  Bone density scan. This is done to screen for osteoporosis.  Talk with your health care provider about your test results, treatment options, and if necessary, the need for more tests.  Follow these instructions at home:  Eating and drinking    Eat a diet that includes fresh fruits and vegetables, whole grains, lean protein, and low-fat dairy products.  Take vitamin and mineral supplements as recommended by your health care provider.  Do not drink alcohol if:  Your health care provider tells you not to drink.  You are pregnant, may be pregnant, or are planning to become pregnant.  If you drink alcohol:  Limit how much you have to 0-1 drink a day.  Know how much alcohol is in your drink. In the [place] one drink equals one 12 oz bottle of beer (355 mL), one 5 oz glass of wine (148 mL), or one 1 oz glass of hard liquor (44 mL).  Lifestyle  Brush your teeth every morning and night with fluoride toothpaste. Floss one time each day.  Exercise for at least  30 minutes 5 or more days each week.  Do not use any products that contain nicotine or tobacco. These products include cigarettes, chewing tobacco, and vaping devices, such as e-cigarettes. If you need help quitting, ask your health care provider.  Do not use drugs.  If you are sexually active, practice safe sex. Use a condom or other form of protection to prevent STIs.  If you do not wish to become pregnant, use a form of birth control. If you plan to become pregnant, see your health care provider for a  prepregnancy visit.  Take aspirin only as told by your health care provider. Make sure that you understand how much to take and what form to take. Work with your health care provider to find out whether it is safe and beneficial for you to take aspirin daily.  Find healthy ways to manage stress, such as:  Meditation, yoga, or listening to music.  Journaling.  Talking to a trusted person.  Spending time with friends and family.  Minimize exposure to UV radiation to reduce your risk of skin cancer.  Safety  Always wear your seat belt while driving or riding in a vehicle.  Do not drive:  If you have been drinking alcohol. Do not ride with someone who has been drinking.  When you are tired or distracted.  While texting.  If you have been using any mind-altering substances or drugs.  Wear a helmet and other protective equipment during sports activities.  If you have firearms in your house, make sure you follow all gun safety procedures.  Seek help if you have been physically or sexually abused.  What's next?  Visit your health care provider once a year for an annual wellness visit.  Ask your health care provider how often you should have your eyes and teeth checked.  Stay up to date on all vaccines.  This information is not intended to replace advice given to you by your health care provider. Make sure you discuss any questions you have with your health care provider.  Document Revised: 02/12/2021 Document Reviewed: 02/12/2021  Elsevier Patient Education  2024 [employer].

## 2023-09-28 NOTE — Progress Notes (Signed)
Complete physical exam  Patient: Renee Fletcher   DOB: 06-26-1971   53 y.o. Female  MRN: [id number]  Subjective:    Chief Complaint  Patient presents with   Annual Exam    Easton Azaya Goedde is a 53 y.o. female who presents today for a complete physical exam. She reports consuming a general diet.  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  She generally feels well. She reports sleeping fairly well. She does not have additional problems to discuss today.    Most recent fall risk assessment:    09/28/2023    8:55 AM  Fall Risk   Falls in the past year? 0  Number falls in past yr: 0  Injury with Fall? 0  Risk for fall due to : No Fall Risks  Follow up Falls evaluation completed     Most recent depression screenings:    09/28/2023    8:55 AM 02/06/2022    2:10 PM  PHQ 2/9 Scores  PHQ - 2 Score 0 0  PHQ- 9 Score  3    Vision:Within last year, Dental: No current dental problems and Receives regular dental care, and STD: The patient denies history of sexually transmitted disease.    Patient Care Team: Christen Butter, NP as PCP - General (Nurse Practitioner)   Outpatient Medications Prior to Visit  Medication Sig   BIOTIN PO Take by mouth.   COLLAGEN PO Take by mouth.   CREATINE PO Take by mouth.   ipratropium (ATROVENT) 0.03 % nasal spray Place 2 sprays into both nostrils every 12 (twelve) hours.   loratadine-pseudoephedrine (CLARITIN-D 12-HOUR) 5-120 MG tablet Take 1 tablet by mouth daily.    Magnesium 250 MG TABS Take by mouth.   Multiple Vitamin (MULTIVITAMINS PO) Take by mouth.   Multiple Vitamins-Minerals (HAIR SKIN NAILS PO) Hair, Skin and Nails Advanced   Nutritional Supplements (ESTROVEN PO) Estroven   Saccharomyces boulardii (PROBIOTIC) 250 MG CAPS Take by mouth.   scopolamine (TRANSDERM-SCOP) 1 MG/3DAYS Place 1 patch (1.5 mg total) onto the skin every 3 (three) days.   [DISCONTINUED] ciclopirox (PENLAC) 8 % solution Apply topically at bedtime. Apply over  nail and surrounding skin. Apply daily over previous coat. After seven (7) days, may remove with alcohol and continue cycle.   No facility-administered medications prior to visit.    Review of Systems  Constitutional:  Negative for chills, fever, malaise/fatigue and weight loss.  HENT:  Negative for congestion, ear pain, hearing loss, sinus pain and sore throat.   Eyes:  Negative for blurred vision, photophobia and pain.  Respiratory:  Negative for cough, shortness of breath and wheezing.   Cardiovascular:  Negative for chest pain, palpitations and leg swelling.  Gastrointestinal:  Negative for abdominal pain, constipation, diarrhea, heartburn, nausea and vomiting.  Genitourinary:  Negative for dysuria, frequency and urgency.       Postcoital spotting  Musculoskeletal:  Negative for falls and neck pain.  Skin:  Negative for itching and rash.  Neurological:  Negative for dizziness, weakness and headaches.  Endo/Heme/Allergies:  Negative for polydipsia. Does not bruise/bleed easily.  Psychiatric/Behavioral:  Negative for depression, substance abuse and suicidal ideas. The patient is not nervous/anxious and does not have insomnia.      Objective:     BP 114/77 (BP Location: Left Arm, Cuff Size: Normal)   Pulse 72   Resp 20   Ht 5\' 8"  (1.727 m)   Wt 138 lb 6.4 oz (62.8 kg)  BMI 21.04 kg/m    Physical Exam Vitals reviewed.  Constitutional:      General: She is not in acute distress.    Appearance: Normal appearance. She is not ill-appearing.  HENT:     Head: Normocephalic and atraumatic.     Right Ear: Tympanic membrane, ear canal and external ear normal. There is no impacted cerumen.     Left Ear: Tympanic membrane, ear canal and external ear normal. There is no impacted cerumen.     Nose: Nose normal. No congestion or rhinorrhea.     Mouth/Throat:     Mouth: Mucous membranes are moist.     Pharynx: No oropharyngeal exudate or posterior oropharyngeal erythema.  Eyes:      General: No scleral icterus.       Right eye: No discharge.        Left eye: No discharge.     Extraocular Movements: Extraocular movements intact.     Conjunctiva/sclera: Conjunctivae normal.     Pupils: Pupils are equal, round, and reactive to light.  Neck:     Thyroid: No thyromegaly.     Vascular: No carotid bruit or JVD.     Trachea: Trachea normal.  Cardiovascular:     Rate and Rhythm: Normal rate and regular rhythm.     Pulses: Normal pulses.     Heart sounds: Normal heart sounds. No murmur heard.    No friction rub. No gallop.  Pulmonary:     Effort: Pulmonary effort is normal. No respiratory distress.     Breath sounds: Normal breath sounds. No wheezing.  Abdominal:     General: Bowel sounds are normal. There is no distension.     Palpations: Abdomen is soft.     Tenderness: There is no abdominal tenderness. There is no guarding.  Musculoskeletal:        General: Normal range of motion.     Cervical back: Normal range of motion and neck supple.  Lymphadenopathy:     Cervical: No cervical adenopathy.  Skin:    General: Skin is warm and dry.  Neurological:     Mental Status: She is alert and oriented to person, place, and time.     Cranial Nerves: No cranial nerve deficit.  Psychiatric:        Mood and Affect: Mood normal.        Behavior: Behavior normal.        Thought Content: Thought content normal.        Judgment: Judgment normal.      No results found for any visits on 09/28/23.     Assessment & Plan:    Routine Health Maintenance and Physical Exam  Immunization History  Administered Date(s) Administered   Tdap 04/01/2016   Zoster Recombinant(Shingrix) 09/28/2019, 01/24/2020    Health Maintenance  Topic Date Due   Hepatitis C Screening  Never done   COVID-19 Vaccine (1 - 2024-25 season) 10/14/2023 (Originally 05/02/2023)   INFLUENZA VACCINE  11/29/2023 (Originally 04/01/2023)   HIV Screening  09/27/2024 (Originally 11/18/1983)   MAMMOGRAM   11/01/2023   Cervical Cancer Screening (HPV/Pap Cotest)  10/31/2024   DTaP/Tdap/Td (2 - Td or Tdap) 04/01/2026   Colonoscopy  12/28/2026   Zoster Vaccines- Shingrix  Completed   HPV VACCINES  Aged Out    Discussed health benefits of physical activity, and encouraged her to engage in regular exercise appropriate for her age and condition.  1. Annual physical exam (Primary) Checking labs as below. UTD on preventative  care. Wellness information provided with AVS. - Lipid panel - CBC with Differential/Platelet - CMP14+EGFR  2. Lipid screening Checking lipids today.  - Lipid panel  3. Family history of diabetes mellitus Checking A1c. - Hemoglobin A1c   Return in about 1 year (around 09/27/2024) for annual physical exam or sooner if needed.     Christen Butter, NP

## 2023-09-29 LAB — CMP14+EGFR
ALT: 15 [IU]/L (ref 0–32)
AST: 23 [IU]/L (ref 0–40)
Albumin: 4.4 g/dL (ref 3.8–4.9)
Alkaline Phosphatase: 102 [IU]/L (ref 44–121)
BUN/Creatinine Ratio: 15 (ref 9–23)
BUN: 14 mg/dL (ref 6–24)
Bilirubin Total: 0.8 mg/dL (ref 0.0–1.2)
CO2: 24 mmol/L (ref 20–29)
Calcium: 9.3 mg/dL (ref 8.7–10.2)
Chloride: 103 mmol/L (ref 96–106)
Creatinine, Ser: 0.91 mg/dL (ref 0.57–1.00)
Globulin, Total: 2.6 g/dL (ref 1.5–4.5)
Glucose: 90 mg/dL (ref 70–99)
Potassium: 4.2 mmol/L (ref 3.5–5.2)
Sodium: 142 mmol/L (ref 134–144)
Total Protein: 7 g/dL (ref 6.0–8.5)
eGFR: 75 mL/min/{1.73_m2} (ref >59)

## 2023-09-29 LAB — CBC WITH DIFFERENTIAL/PLATELET
Basophils Absolute: 0 10*3/uL (ref 0.0–0.2)
Basos: 1 %
EOS (ABSOLUTE): 0.1 10*3/uL (ref 0.0–0.4)
Eos: 2 %
Hematocrit: 40.7 % (ref 34.0–46.6)
Hemoglobin: 13.7 g/dL (ref 11.1–15.9)
Immature Grans (Abs): 0 10*3/uL (ref 0.0–0.1)
Immature Granulocytes: 0 %
Lymphocytes Absolute: 1.3 10*3/uL (ref 0.7–3.1)
Lymphs: 43 %
MCH: 32.5 pg (ref 26.6–33.0)
MCHC: 33.7 g/dL (ref 31.5–35.7)
MCV: 97 fL (ref 79–97)
Monocytes Absolute: 0.4 10*3/uL (ref 0.1–0.9)
Monocytes: 13 %
Neutrophils Absolute: 1.2 10*3/uL — ABNORMAL LOW (ref 1.4–7.0)
Neutrophils: 41 %
Platelets: 310 10*3/uL (ref 150–450)
RBC: 4.21 x10E6/uL (ref 3.77–5.28)
RDW: 11.7 % (ref 11.7–15.4)
WBC: 3 10*3/uL — ABNORMAL LOW (ref 3.4–10.8)

## 2023-09-29 LAB — LIPID PANEL
Chol/HDL Ratio: 2.7 {ratio} (ref 0.0–4.4)
Cholesterol, Total: 183 mg/dL (ref 100–199)
HDL: 67 mg/dL (ref >39)
LDL Chol Calc (NIH): 99 mg/dL (ref 0–99)
Triglycerides: 93 mg/dL (ref 0–149)
VLDL Cholesterol Cal: 17 mg/dL (ref 5–40)

## 2023-09-29 LAB — HEMOGLOBIN A1C
Est. average glucose Bld gHb Est-mCnc: 114 mg/dL
Hgb A1c MFr Bld: 5.6 % (ref 4.8–5.6)

## 2024-02-28 ENCOUNTER — Telehealth: Payer: Self-pay | Admitting: Medical-Surgical

## 2024-02-28 NOTE — Telephone Encounter (Unsigned)
 Copied from CRM [id number]. Topic: Clinical - Medication Refill >> Feb 28, 2024  3:18 PM Suzette B wrote: Medication: scopolamine  (TRANSDERM-SCOP) 1 MG/3DAYS  Has the patient contacted their pharmacy? No Patient stated it had been a long time since patches   This is the patient's preferred pharmacy:  [facility] [id number] - [place], [place] - [address] [place] [address] Phone: [phone] Fax: [phone]   Is this the correct pharmacy for this prescription? Yes If no, delete pharmacy and type the correct one.   Has the prescription been filled recently? No  Is the patient out of the medication? Yes  Has the patient been seen for an appointment in the last year OR does the patient have an upcoming appointment? Yes  Can we respond through MyChart? No  Agent: Please be advised that Rx refills may take up to 3 business days. We ask that you follow-up with your pharmacy.

## 2024-03-02 ENCOUNTER — Telehealth: Payer: Self-pay

## 2024-03-02 ENCOUNTER — Other Ambulatory Visit: Payer: Self-pay

## 2024-03-02 MED ORDER — SCOPOLAMINE 1 MG/3DAYS TD PT72: 1.0000 | MEDICATED_PATCH | TRANSDERMAL | 1 refills | Status: AC

## 2024-03-02 NOTE — Telephone Encounter (Signed)
 Copied from CRM [id number]. Topic: Clinical - Medication Refill >> Feb 28, 2024  3:18 PM Suzette B wrote: Medication: scopolamine  (TRANSDERM-SCOP) 1 MG/3DAYS  Has the patient contacted their pharmacy? No Patient stated it had been a long time since patches   This is the patient's preferred pharmacy:  [id number] - [place], [place] - [address] [place] [address] Phone: [phone] Fax: [phone]   Is this the correct pharmacy for this prescription? Yes If no, delete pharmacy and type the correct one.   Has the prescription been filled recently? No  Is the patient out of the medication? Yes  Has the patient been seen for an appointment in the last year OR does the patient have an upcoming appointment? Yes  Can we respond through MyChart? No  Agent: Please be advised that Rx refills may take up to 3 business days. We ask that you follow-up with your pharmacy.

## 2024-03-02 NOTE — Telephone Encounter (Signed)
 Medication sent today.

## 2024-03-13 ENCOUNTER — Ambulatory Visit (INDEPENDENT_AMBULATORY_CARE_PROVIDER_SITE_OTHER): Admitting: Medical-Surgical

## 2024-03-13 ENCOUNTER — Ambulatory Visit
Admission: EM | Admit: 2024-03-13 | Discharge: 2024-03-13 | Disposition: A | Discharge status: Home / Self Care | Attending: Family Medicine | Admitting: Family Medicine

## 2024-03-13 ENCOUNTER — Encounter: Payer: Self-pay | Admitting: Medical-Surgical

## 2024-03-13 VITALS — BP 142/79 | HR 89 | Resp 20 | Ht 68.0 in | Wt 136.0 lb

## 2024-03-13 DIAGNOSIS — W5501XA Bitten by cat, initial encounter: Secondary | ICD-10-CM

## 2024-03-13 DIAGNOSIS — S61452A Open bite of left hand, initial encounter: Secondary | ICD-10-CM

## 2024-03-13 DIAGNOSIS — Z23 Encounter for immunization: Secondary | ICD-10-CM

## 2024-03-13 MED ORDER — RABIES IMMUNE GLOBULIN 300 UNIT/2ML IJ SOLN
20.0000 [IU]/kg | Freq: Once | INTRAMUSCULAR | Status: AC
  Administered 2024-03-13: 1200 [IU] via INTRAMUSCULAR

## 2024-03-13 MED ORDER — METRONIDAZOLE 500 MG PO TABS: 500.0000 mg | ORAL_TABLET | Freq: Three times a day (TID) | ORAL | 0 refills | Status: AC

## 2024-03-13 MED ORDER — RABIES VACCINE, PCEC IM SUSR
1.0000 mL | Freq: Once | INTRAMUSCULAR | Status: AC
  Administered 2024-03-13: 1 mL via INTRAMUSCULAR

## 2024-03-13 MED ORDER — SULFAMETHOXAZOLE-TRIMETHOPRIM 800-160 MG PO TABS: 1.0000 | ORAL_TABLET | Freq: Two times a day (BID) | ORAL | 0 refills | Status: DC

## 2024-03-13 NOTE — Discharge Instructions (Signed)
 You need rabies vaccinations on July 17, July 21, July 28 (day 0, 3, 7 and 14)

## 2024-03-13 NOTE — ED Triage Notes (Signed)
 Pt states that she was bit by a stray cat on her left index finger. X1 week

## 2024-03-13 NOTE — Progress Notes (Signed)
        Established patient visit  Discussed the use of AI scribe software for clinical note transcription with the patient, who gave verbal consent to proceed.  History of Present Illness   Renee Fletcher is a 53 year old female who presents with concerns about a cat bite and potential rabies exposure.  Animal bite wound - Sustained a cat bite to the finger while feeding stray cats in [place] - Resulted in a small puncture wound with slight bleeding - Wound was washed thoroughly and antibiotic ointment was applied - No symptoms of local or systemic infection: no fever, muscle aches, night sweats, or weight loss  Rabies exposure concern - Bitten by a kitten of unknown rabies vaccination status - Concerned about potential rabies exposure due to stray cat population in [place]  Immunization status - Last tetanus vaccination in 2017  Medication allergy - Allergic to penicillins      Physical Exam  Assessment and Plan    Cat bite Sustained a cat bite with concern for rabies exposure. Allergic to penicillins, requiring alternative antibiotics. - Prescribe Bactrim  1 tablet twice daily for 3 days. - Prescribe metronidazole  500 mg three times daily for 3 days. - Administer tetanus booster. - Refer to urgent care for rabies post-exposure prophylaxis series.  Rabies exposure Potential rabies exposure from a cat bite in [place]. Rabies post-exposure prophylaxis required. - Refer to urgent care for rabies post-exposure prophylaxis series. - Instruct her to receive rabies vaccine  on days 0, 3, 7, and 14. - Discussed options for receiving the rabies vaccine  at urgent care or the Department of Health.  Tetanus prophylaxis Tetanus booster needed due to cat bite and last booster in 2017. - Administer tetanus booster.  Follow-up Follow-up needed to ensure completion of rabies vaccine  series and monitor for infection or adverse reactions. - Instruct her to follow up with  urgent care for rabies vaccine  series. - Advise her to monitor for signs of infection and contact the clinic if symptoms develop.  Recording duration: 17 minutes      Return if symptoms worsen or fail to improve.  __________________________________ Zada FREDRIK Palin, DNP, APRN, FNP-BC Primary Care and Sports Medicine [facility] [place]

## 2024-03-13 NOTE — ED Provider Notes (Signed)
 [facility]    CSN: [id number] Arrival date & time: 03/13/24  1629      History   Chief Complaint Chief Complaint  Patient presents with   Animal Bite    HPI Renee Fletcher is a 53 y.o. female.   HPI  While on vacation in [place] patient was bitten by a Estate manager/land agent.  She had a puncture wound on her finger.  No blood.  She has just come back and saw her primary care doctor today.  The primary care doctor has recommended rabies injections.  Patient feels well.  Tetanus booster today I looked at the [facility] and [facility] information regarding rabies in [place].  They do not have a comprehensive program although they have been successful vaccinating dogs.  There is no mention of cats.  There is still rabies in the [place].  Past Medical History:  Diagnosis Date   Allergic rhinitis 09/29/2012    Patient Active Problem List   Diagnosis Date Noted   Urinary incontinence 09/28/2023   Vertigo 09/28/2023   Varicose vein of leg 01/18/2017   Reactive depression (situational) 04/02/2016   Allergic rhinitis 09/29/2012    Past Surgical History:  Procedure Laterality Date   left femur fracture internal fixation      OB History   No obstetric history on file.      Home Medications    Prior to Admission medications   Medication Sig Start Date End Date Taking? Authorizing Provider  BIOTIN PO Take by mouth.   Yes [provider]  COLLAGEN PO Take by mouth.   Yes [provider]  CREATINE PO Take by mouth.   Yes [provider]  ipratropium (ATROVENT ) 0.03 % nasal spray Place 2 sprays into both nostrils every 12 (twelve) hours. 02/09/22  Yes Willo Mini, NP  loratadine-pseudoephedrine (CLARITIN-D 12-HOUR) 5-120 MG tablet Take 1 tablet by mouth daily.    Yes [provider]  Magnesium 250 MG TABS Take by mouth.   Yes [provider]  metroNIDAZOLE  (FLAGYL ) 500 MG tablet Take 1 tablet (500 mg total) by mouth 3 (three) times daily  for 3 days. 03/13/24 03/16/24 Yes Willo Mini, NP  Multiple Vitamin (MULTIVITAMINS PO) Take by mouth.   Yes [provider]  Multiple Vitamins-Minerals (HAIR SKIN NAILS PO) Hair, Skin and Nails Advanced   Yes [provider]  Nutritional Supplements (ESTROVEN PO) Estroven   Yes [provider]  Saccharomyces boulardii (PROBIOTIC) 250 MG CAPS Take by mouth.   Yes [provider]  scopolamine  (TRANSDERM-SCOP) 1 MG/3DAYS Place 1 patch (1.5 mg total) onto the skin every 3 (three) days. 03/02/24  Yes Willo Mini, NP  sulfamethoxazole -trimethoprim  (BACTRIM  DS) 800-160 MG tablet Take 1 tablet by mouth 2 (two) times daily. 03/13/24  Yes Willo Mini, NP    Family History Family History  Problem Relation Age of Onset   Liver cancer Other        aunt   Diabetes Father    Cancer Maternal Aunt        pancreatic and liver    Hypertension Maternal Grandmother    Cancer Maternal Grandmother        breast   Breast cancer Maternal Grandmother    Hypertension Maternal Grandfather    Cancer Paternal Grandfather        liver    Social History Social History   Tobacco Use   Smoking status: Never   Smokeless tobacco: Never  Vaping Use  Vaping status: Never Used  Substance Use Topics   Alcohol use: No   Drug use: No     Allergies   Penicillins   Review of Systems Review of Systems See HPI  Physical Exam Triage Vital Signs ED Triage Vitals  Encounter Vitals Group     BP 03/13/24 1636 126/89     Girls Systolic BP Percentile --      Girls Diastolic BP Percentile --      Boys Systolic BP Percentile --      Boys Diastolic BP Percentile --      Pulse Rate 03/13/24 1636 87     Resp 03/13/24 1636 19     Temp 03/13/24 1636 99.6 F (37.6 C)     Temp Source 03/13/24 1636 Oral     SpO2 03/13/24 1636 97 %     Weight 03/13/24 1634 134 lb 4.8 oz (60.9 kg)     Height 03/13/24 1635 5' 8 (1.727 m)     Head Circumference --      Peak Flow --      Pain Score  03/13/24 1635 0     Pain Loc --      Pain Education --      Exclude from Growth Chart --    No data found.  Updated Vital Signs BP 126/89 (BP Location: Right Arm)   Pulse 87   Temp 99.6 F (37.6 C) (Oral)   Resp 19   Ht 5' 8 (1.727 m)   Wt 60.9 kg   SpO2 97%   BMI 20.42 kg/m       Physical Exam Constitutional:      General: She is not in acute distress.    Appearance: Normal appearance. She is well-developed.  HENT:     Head: Normocephalic and atraumatic.  Eyes:     Conjunctiva/sclera: Conjunctivae normal.     Pupils: Pupils are equal, round, and reactive to light.  Cardiovascular:     Rate and Rhythm: Normal rate.  Pulmonary:     Effort: Pulmonary effort is normal. No respiratory distress.  Abdominal:     General: There is no distension.     Palpations: Abdomen is soft.  Musculoskeletal:        General: Normal range of motion.     Cervical back: Normal range of motion.  Skin:    General: Skin is warm and dry.     Findings: Lesion present.     Comments: Tiny puncture visible at the edge of the nail on the left index finger.  No cellulitis or tenderness or drainage  Neurological:     Mental Status: She is alert.      UC Treatments / Results  Labs (all labs ordered are listed, but only abnormal results are displayed) Labs Reviewed - No data to display  EKG   Radiology No results found.  Procedures Procedures (including critical care time)  Medications Ordered in UC Medications  rabies immune globulin  (HYPERRAB) injection 1,200 Units (1,200 Units Intramuscular Given 03/13/24 1658)  rabies vaccine  (RABAVERT ) injection 1 mL (1 mL Intramuscular Given 03/13/24 1658)    Initial Impression / Assessment and Plan / UC Course  I have reviewed the triage vital signs and the nursing notes.  Pertinent labs & imaging results that were available during my care of the patient were reviewed by me and considered in my medical decision making (see chart for  details).     Discussed pros and cons of rabies vaccinations.  Patient states I am paranoid and would feel better if she had the vaccinations.  I told her since I do not have any information about the rabies incidence in [place], I do recommend she have her rabies immunoglobulin and vaccination series. Final Clinical Impressions(s) / UC Diagnoses   Final diagnoses:  Cat bite of left hand, initial encounter  Need for rabies vaccination     Discharge Instructions      You need rabies vaccinations on July 17, July 21, July 28 (day 0, 3, 7 and 14)   ED Prescriptions   None    PDMP not reviewed this encounter.   Maranda Jamee Jacob, MD 03/13/24 1745

## 2024-03-16 ENCOUNTER — Ambulatory Visit
Admission: EM | Admit: 2024-03-16 | Discharge: 2024-03-16 | Disposition: A | Discharge status: Home / Self Care | Attending: Family Medicine | Admitting: Family Medicine

## 2024-03-16 DIAGNOSIS — Z203 Contact with and (suspected) exposure to rabies: Secondary | ICD-10-CM

## 2024-03-16 DIAGNOSIS — Z23 Encounter for immunization: Secondary | ICD-10-CM

## 2024-03-16 MED ORDER — RABIES VACCINE, PCEC IM SUSR
1.0000 mL | Freq: Once | INTRAMUSCULAR | Status: AC
  Administered 2024-03-16: 1 mL via INTRAMUSCULAR

## 2024-03-16 NOTE — ED Triage Notes (Signed)
 Pt presents to uc with need for rabies shot (2nd vaccine)

## 2024-03-20 ENCOUNTER — Ambulatory Visit
Admission: EM | Admit: 2024-03-20 | Discharge: 2024-03-20 | Disposition: A | Discharge status: Home / Self Care | Attending: Family Medicine | Admitting: Family Medicine

## 2024-03-20 DIAGNOSIS — Z23 Encounter for immunization: Secondary | ICD-10-CM | POA: Diagnosis not present

## 2024-03-20 MED ORDER — RABIES VACCINE, PCEC IM SUSR
1.0000 mL | Freq: Once | INTRAMUSCULAR | Status: AC
  Administered 2024-03-20: 1 mL via INTRAMUSCULAR

## 2024-03-20 NOTE — ED Triage Notes (Signed)
 Pt presents for 3rd RABIES SHOT

## 2024-03-27 ENCOUNTER — Ambulatory Visit
Admission: RE | Admit: 2024-03-27 | Discharge: 2024-03-27 | Disposition: A | Discharge status: Home / Self Care | Source: Ambulatory Visit | Attending: Family Medicine | Admitting: Family Medicine

## 2024-03-27 DIAGNOSIS — Z203 Contact with and (suspected) exposure to rabies: Secondary | ICD-10-CM

## 2024-03-27 DIAGNOSIS — Z23 Encounter for immunization: Secondary | ICD-10-CM | POA: Diagnosis not present

## 2024-03-27 MED ORDER — RABIES VACCINE, PCEC IM SUSR
1.0000 mL | Freq: Once | INTRAMUSCULAR | Status: AC
  Administered 2024-03-27: 1 mL via INTRAMUSCULAR

## 2024-03-27 NOTE — ED Provider Notes (Signed)
 Patient is here for her rabies vaccination.  No complaint    Maranda Jamee Jacob, MD 03/27/24 1354

## 2024-03-27 NOTE — ED Triage Notes (Signed)
Last rabies shot.

## 2024-04-22 ENCOUNTER — Ambulatory Visit
Admission: RE | Admit: 2024-04-22 | Discharge: 2024-04-22 | Disposition: A | Discharge status: Home / Self Care | Source: Ambulatory Visit | Attending: Family Medicine | Admitting: Family Medicine

## 2024-04-22 ENCOUNTER — Other Ambulatory Visit: Payer: Self-pay

## 2024-04-22 VITALS — BP 122/79 | HR 76 | Temp 98.4°F | Resp 19

## 2024-04-22 DIAGNOSIS — R300 Dysuria: Secondary | ICD-10-CM

## 2024-04-22 DIAGNOSIS — N3000 Acute cystitis without hematuria: Secondary | ICD-10-CM | POA: Diagnosis not present

## 2024-04-22 LAB — POCT URINALYSIS DIP (MANUAL ENTRY)
Bilirubin, UA: NEGATIVE
Glucose, UA: NEGATIVE mg/dL
Ketones, POC UA: NEGATIVE mg/dL
Nitrite, UA: NEGATIVE
Protein Ur, POC: NEGATIVE mg/dL
Spec Grav, UA: <=1.005 — AB (ref 1.010–1.025)
Urobilinogen, UA: 0.2 U/dL
pH, UA: 5.5 (ref 5.0–8.0)

## 2024-04-22 MED ORDER — NITROFURANTOIN MONOHYD MACRO 100 MG PO CAPS: 100.0000 mg | ORAL_CAPSULE | Freq: Two times a day (BID) | ORAL | 0 refills | Status: AC

## 2024-04-22 NOTE — ED Triage Notes (Signed)
 Pt presents to uc with dysuria and urinary pressure and frequency since yesterday. No otc medications.

## 2024-04-22 NOTE — Discharge Instructions (Addendum)
 Advised patient take medication as directed with food to completion.  Encouraged to increase daily water intake to 64 ounces per day while taking this medication.  Advised we will follow-up with urine culture results once received.  Advised if symptoms worsen and/or unresolved please follow-up with your PCP, [facility] urology, or here for further evaluation.

## 2024-04-22 NOTE — ED Provider Notes (Signed)
 [facility]    CSN: [id number] Arrival date & time: 04/22/24  1422      History   Chief Complaint Chief Complaint  Patient presents with   Urinary Frequency    pressure, feels like I cant empty bladder cmpletely - Entered by patient   Dysuria    HPI Renee Fletcher is a 53 y.o. female.   HPI 53 year old female presents with dysuria, urinary pressure, and frequency since yesterday.  PMH significant for reactive depression and vertigo.  Past Medical History:  Diagnosis Date   Allergic rhinitis 09/29/2012    Patient Active Problem List   Diagnosis Date Noted   Urinary incontinence 09/28/2023   Vertigo 09/28/2023   Varicose vein of leg 01/18/2017   Reactive depression (situational) 04/02/2016   Allergic rhinitis 09/29/2012    Past Surgical History:  Procedure Laterality Date   left femur fracture internal fixation      OB History   No obstetric history on file.      Home Medications    Prior to Admission medications   Medication Sig Start Date End Date Taking? Authorizing Provider  nitrofurantoin , macrocrystal-monohydrate, (MACROBID ) 100 MG capsule Take 1 capsule (100 mg total) by mouth 2 (two) times daily for 7 days. 04/22/24 04/29/24 Yes Teddy Sharper, FNP  BIOTIN PO Take by mouth.    [provider]  COLLAGEN PO Take by mouth.    [provider]  CREATINE PO Take by mouth.    [provider]  ipratropium (ATROVENT ) 0.03 % nasal spray Place 2 sprays into both nostrils every 12 (twelve) hours. 02/09/22   Willo Mini, NP  loratadine-pseudoephedrine (CLARITIN-D 12-HOUR) 5-120 MG tablet Take 1 tablet by mouth daily.     [provider]  Magnesium 250 MG TABS Take by mouth.    [provider]  Multiple Vitamin (MULTIVITAMINS PO) Take by mouth.    [provider]  Multiple Vitamins-Minerals (HAIR SKIN NAILS PO) [facility], Skin [facility] N[employer] [facility]    [provider]  Nutritional Supplements  (ESTROVEN PO) Estroven    [provider]  Saccharomyces boulardii (PROBIOTIC) 250 MG CAPS Take by mouth.    [provider]  scopolamine  (TRANSDERM-SCOP) 1 MG/3DAYS Place 1 patch (1.5 mg total) onto the skin every 3 (three) days. 03/02/24   Willo Mini, NP  sulfamethoxazole -trimethoprim  (BACTRIM  DS) 800-160 MG tablet Take 1 tablet by mouth 2 (two) times daily. 03/13/24   Willo Mini, NP    Family History Family History  Problem Relation Age of Onset   Liver cancer Other        aunt   Diabetes Father    Cancer Maternal Aunt        pancreatic and liver    Hypertension Maternal Grandmother    Cancer Maternal Grandmother        breast   Breast cancer Maternal Grandmother    Hypertension Maternal Grandfather    Cancer Paternal Grandfather        liver    Social History Social History   Tobacco Use   Smoking status: Never   Smokeless tobacco: Never  Vaping Use   Vaping status: Never Used  Substance Use Topics   Alcohol use: No   Drug use: No     Allergies   Penicillins   Review of Systems Review of Systems   Physical Exam Triage Vital Signs ED Triage Vitals  Encounter Vitals Group     BP      Girls Systolic  BP Percentile      Girls Diastolic BP Percentile      Boys Systolic BP Percentile      Boys Diastolic BP Percentile      Pulse      Resp      Temp      Temp src      SpO2      Weight      Height      Head Circumference      Peak Flow      Pain Score      Pain Loc      Pain Education      Exclude from Growth Chart    No data found.  Updated Vital Signs BP 122/79   Pulse 76   Temp 98.4 F (36.9 C)   Resp 19   SpO2 98%   Visual Acuity Right Eye Distance:   Left Eye Distance:   Bilateral Distance:    Right Eye Near:   Left Eye Near:    Bilateral Near:     Physical Exam Vitals and nursing note reviewed.  Constitutional:      Appearance: Normal appearance. She is normal weight.  HENT:     Head: Normocephalic and  atraumatic.     Mouth/Throat:     Mouth: Mucous membranes are moist.     Pharynx: Oropharynx is clear.  Eyes:     Extraocular Movements: Extraocular movements intact.     Pupils: Pupils are equal, round, and reactive to light.  Cardiovascular:     Rate and Rhythm: Normal rate.     Pulses: Normal pulses.     Heart sounds: Normal heart sounds.  Pulmonary:     Effort: Pulmonary effort is normal.     Breath sounds: Normal breath sounds. No wheezing, rhonchi or rales.  Musculoskeletal:        General: Normal range of motion.  Skin:    General: Skin is warm and dry.  Neurological:     General: No focal deficit present.     Mental Status: She is alert and oriented to person, place, and time. Mental status is at baseline.  Psychiatric:        Mood and Affect: Mood normal.        Behavior: Behavior normal.      UC Treatments / Results  Labs (all labs ordered are listed, but only abnormal results are displayed) Labs Reviewed  POCT URINALYSIS DIP (MANUAL ENTRY) - Abnormal; Notable for the following components:      Result Value   Spec Grav, UA <=1.005 (*)    Blood, UA small (*)    Leukocytes, UA Trace (*)    All other components within normal limits  URINE CULTURE    EKG   Radiology No results found.  Procedures Procedures (including critical care time)  Medications Ordered in UC Medications - No data to display  Initial Impression / Assessment and Plan / UC Course  I have reviewed the triage vital signs and the nursing notes.  Pertinent labs & imaging results that were available during my care of the patient were reviewed by me and considered in my medical decision making (see chart for details).     MDM: 1.  Acute cystitis with hematuria-UA revealed above, urine culture ordered Rx'd Macrobid  100 mg capsule: Take 1 capsule twice daily x 7 days; 2.  Dysuria-UA revealed above, urine culture ordered, Rx'd Macrobid  100 mg capsule: Take 1 capsule twice daily x 7  days.  Advised patient take medication as directed with food to completion.  Encouraged to increase daily water intake to 64 ounces per day while taking this medication.  Advised we will follow-up with urine culture results once received.  Advised if symptoms worsen and/or unresolved please follow-up with your PCP, [facility] urology, or here for further evaluation.  Patient discharged home, hemodynamically stable Final Clinical Impressions(s) / UC Diagnoses   Final diagnoses:  Dysuria  Acute cystitis without hematuria   Discharge Instructions   None    ED Prescriptions     Medication Sig Dispense Auth. Provider   nitrofurantoin , macrocrystal-monohydrate, (MACROBID ) 100 MG capsule Take 1 capsule (100 mg total) by mouth 2 (two) times daily for 7 days. 14 capsule Gurkirat Basher, FNP      PDMP not reviewed this encounter.   Teddy Sharper, FNP 04/22/24 1453

## 2024-04-24 LAB — URINE CULTURE: Culture: 50000 — AB

## 2024-04-25 ENCOUNTER — Ambulatory Visit (HOSPITAL_COMMUNITY): Payer: Self-pay
